# Patient Record
Sex: Female | Born: 1960 | Race: Black or African American | Hispanic: No | Marital: Married | Smoking: Former smoker
Health system: Southern US, Community
[De-identification: ages and names within clinical notes are randomized; demographics above are authoritative.]

## PROBLEM LIST (undated history)

## (undated) DIAGNOSIS — R51 Headache: Secondary | ICD-10-CM

## (undated) DIAGNOSIS — N139 Obstructive and reflux uropathy, unspecified: Secondary | ICD-10-CM

## (undated) DIAGNOSIS — J40 Bronchitis, not specified as acute or chronic: Secondary | ICD-10-CM

## (undated) DIAGNOSIS — E569 Vitamin deficiency, unspecified: Secondary | ICD-10-CM

## (undated) DIAGNOSIS — I10 Essential (primary) hypertension: Secondary | ICD-10-CM

## (undated) DIAGNOSIS — M1990 Unspecified osteoarthritis, unspecified site: Secondary | ICD-10-CM

## (undated) DIAGNOSIS — E785 Hyperlipidemia, unspecified: Secondary | ICD-10-CM

## (undated) DIAGNOSIS — A6000 Herpesviral infection of urogenital system, unspecified: Secondary | ICD-10-CM

## (undated) DIAGNOSIS — R5084 Febrile nonhemolytic transfusion reaction: Secondary | ICD-10-CM

## (undated) DIAGNOSIS — D649 Anemia, unspecified: Secondary | ICD-10-CM

## (undated) DIAGNOSIS — G5601 Carpal tunnel syndrome, right upper limb: Secondary | ICD-10-CM

## (undated) DIAGNOSIS — E119 Type 2 diabetes mellitus without complications: Secondary | ICD-10-CM

## (undated) DIAGNOSIS — J189 Pneumonia, unspecified organism: Secondary | ICD-10-CM

## (undated) HISTORY — DX: Febrile nonhemolytic transfusion reaction: R50.84

## (undated) HISTORY — DX: Carpal tunnel syndrome, right upper limb: G56.01

## (undated) HISTORY — DX: Unspecified osteoarthritis, unspecified site: M19.90

## (undated) HISTORY — PX: ABDOMINAL HYSTERECTOMY: SHX81

## (undated) HISTORY — PX: LIVER SURGERY: SHX698

## (undated) HISTORY — DX: Vitamin deficiency, unspecified: E56.9

## (undated) HISTORY — PX: CHOLECYSTECTOMY: SHX55

## (undated) HISTORY — DX: Herpesviral infection of urogenital system, unspecified: A60.00

## (undated) HISTORY — DX: Essential (primary) hypertension: I10

## (undated) HISTORY — PX: DOPPLER ECHOCARDIOGRAPHY: SHX263

## (undated) HISTORY — DX: Hyperlipidemia, unspecified: E78.5

## (undated) HISTORY — PX: HYPERPLASIA TISSUE EXCISION: SHX5201

## (undated) HISTORY — PX: TUMOR REMOVAL: SHX12

## (undated) HISTORY — PX: CARDIOVASCULAR STRESS TEST: SHX262

---

## 1998-10-26 HISTORY — PX: HAMMER TOE SURGERY: SHX385

## 2000-01-20 ENCOUNTER — Encounter: Payer: Self-pay | Admitting: Cardiology

## 2000-01-20 ENCOUNTER — Ambulatory Visit (HOSPITAL_COMMUNITY): Admission: RE | Admit: 2000-01-20 | Discharge: 2000-01-20 | Payer: Self-pay | Admitting: Cardiology

## 2000-01-27 ENCOUNTER — Ambulatory Visit (HOSPITAL_COMMUNITY): Admission: RE | Admit: 2000-01-27 | Discharge: 2000-01-27 | Payer: Self-pay | Admitting: Cardiology

## 2001-08-16 ENCOUNTER — Inpatient Hospital Stay (HOSPITAL_COMMUNITY): Admission: RE | Admit: 2001-08-16 | Discharge: 2001-08-18 | Payer: Self-pay | Admitting: *Deleted

## 2001-08-16 ENCOUNTER — Encounter (INDEPENDENT_AMBULATORY_CARE_PROVIDER_SITE_OTHER): Payer: Self-pay | Admitting: Specialist

## 2003-07-20 ENCOUNTER — Ambulatory Visit (HOSPITAL_COMMUNITY): Admission: RE | Admit: 2003-07-20 | Discharge: 2003-07-20 | Payer: Self-pay | Admitting: *Deleted

## 2003-07-20 ENCOUNTER — Encounter: Payer: Self-pay | Admitting: *Deleted

## 2008-02-02 ENCOUNTER — Encounter: Admission: RE | Admit: 2008-02-02 | Discharge: 2008-02-02 | Payer: Self-pay | Admitting: Family Medicine

## 2011-03-13 NOTE — Cardiovascular Report (Signed)
[place]. [facility]  Patient:    Renee Mills, Renee Mills                        MRN: [id number] Proc. Date: 01/27/00 Attending:  Colleen Can. Deborah Chalk, M.D. CC:         Cardiac Catheterization Laboratory             Dr. Modena Jansky - [facility]                        Cardiac Catheterization  PROCEDURE:  Right and left heart catheterization with selective coronary angiography.  CARDIOLOGIST:  Colleen Can. Deborah Chalk, M.D.  TYPE & SITE OF ENTRY:  Percutaneous right femoral artery, percutaneous right femoral vein.  CATHETERS:  A 7-French Swan-Ganz thermodilution catheter, 6-French pigtail ventriculographic catheter, 6-French #4 curved right coronary catheter, Judkins  left 4.5, Judkins left 3.5, a multipurpose A1 and a multipurpose A2 catheter, an Amplatz left #1 catheter, and subsequently returned to the Judkins left 3.5, modifying it with a heat gun, with multiple curves, and subsequently creation of out of plane catheter to cannulate the left coronary artery.  CONTRAST MATERIAL:  Omnipaque.  LENGTH OF PROCEDURE:  One hour and 45 minutes.  COMMENTS:  In general the patient tolerated the procedure well.  She received Versed 3 mg IV during the procedure, and Valium 10 mg p.o. preprocedure.  HEMODYNAMIC DATA: Right atrial pressure:  R-wave of 11, V-wave of 8, mean of 6. RV:  27/7. PA:  29/16 - 21. Pulmonary capillary wedge pressure:  11. LV:  116/19. Aortic:  114/75. Fick cardiac output:  5.3, with cardiac index of 2.9. Thermodilution cardiac output:  5.3. Cardiac index:  2.8.  ANGIOGRAPHIC DATA: 1. Right coronary artery:  The right coronary artery was a large dominant    vessel.  By way of the conus branch there were collaterals to a septal    perforating branch and some slight filling of what appeared to be a left    anterior descending coronary artery.  Initially with pictures of the right    coronary artery, we had what appeared to be spasm  which responded nicely    to intercoronary nitroglycerin. 2. Left coronary artery:  The left coronary artery was quite difficult to    cannulate.  Multiple catheters were obtained.  We could get filling of the    artery by way of the flush shot in the aortic root, and from the    left ventriculogram we knew that the artery was present.  Using the 3.5 out f    plane catheter we were finally able to cannulate the left coronary artery.    This catheter was shaped with a heat gun. 3. Left main coronary artery:   The left main coronary artery was normal. 4. Left circumflex coronary artery:  The left circumflex was normal. 5. Left anterior descending coronary artery:  The left anterior descending    was tortuous and coursed over the apex.  LEFT VENTRICULAR ANGIOGRAM:  The left ventricular angiogram was performed in the RAO position.  Overall cardiac size and silhouette were normal.  The left ventricular function was normal, with an ejection fraction of 60%.  OVERALL IMPRESSION: 1. 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 2. Normal left ventricular function. 3. Normal right heart pressures.  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  This could partially account for the anterior T-wave changes that she has on her electrocardiogram.  There is some collateral septal flow which suggests that there may be some abnormality of the proximal coronary flow, although it is not felt o be correctable with any mechanical measure, and may warrant some medication if he symptoms persist.  It is felt that probably modification of cardiovascular risk  factors would benefit her most.  The right femoral artery was closed with Perclose after the procedure, with the  left femoral vein hemostasis obtained manually. DD:  01/27/00 TD:  01/27/00 Job:  [id number]

## 2011-03-13 NOTE — H&P (Signed)
[facility]  Patient:    Renee Mills, Renee Mills Visit Number: [id number] MRN: [id number]          Service Type: Attending:  Marina Gravel, M.D. Dictated by:   Marina Gravel, M.D. Adm. Date:  08/16/01                           History and Physical  PREOPERATIVE DIAGNOSIS:       Uterine fibroids and menorrhagia.  INTENDED PROCEDURE:           Total abdominal hysterectomy.  HISTORY OF PRESENT ILLNESS:   This is a 49 year old African-American female, gravida 3, para 3, with a history of menorrhagia and dysmenorrhea. The bleeding has been severe with large clots necessitating use of super tampon and overnight pad changing every 25 minutes to one hour. Also, associated dyspareunia, mostly on left lower quadrant. This has been an issue for the patient for over two year period of time. She cannot leave her home due to the heavy bleeding. She has taken Motrin 800 mg four to five times daily with menses without relief of bleeding. She is unable to use birth control pills as she has a history of a benign liver tumor. She, therefore, presents for definitive surgical therapy with abdominal hysterectomy. Birth control: The husband has had a vasectomy.  PAST MEDICAL HISTORY:         Benign liver tumor as outlined above. This is followed by Dr. Gerri Spore with serial MRIs and currently no evidence of active disease.  PAST SURGICAL HISTORY:        (1) Partial liver excision for benign tumor and associated cholecystectomy. The patient states approximately one half her liver was removed for this surgery. (2) C-section x 3.  MEDICATIONS:                  Zovirax p.r.n.  ALLERGIES:                    DILAUDID, this causes hallucinations. MOTRIN causes gastrointestinal discomfort.  SOCIAL HISTORY:               No alcohol, tobacco, or other drugs.  FAMILY HISTORY:               Liver tumors, diabetes, heart disease, and hypertension.  REVIEW OF SYSTEMS:             CONSTITUTIONAL: The patient has had problems with weight gain. ENT: No trouble with vision, nose, or mouth. CARDIOVASCULAR: No _______ of chest pain, regular heart beat. RESPIRATORY: No cough or shortness of breath. GASTROINTESTINAL: Patient has intolerance to Motrin. GENITOURINARY: Abnormal bleeding as outlined above. MUSCULOSKELETAL: No joint or muscle pain. SKIN/BREAST: No lesions. NEUROLOGICAL: No history of headaches. PSYCHIATRIC: No work or family problems, history of domestic violence, or sexual assault. ENDOCRINE: No hot flashes, thyroid disease or diabetes. HEMATOLOGIC: No unexplained bruising or bleeding from gums.  PHYSICAL EXAMINATION:  VITAL SIGNS:                  Blood pressure 110/72, weight 173, height 5 feet 2-1/2 inches.  GENERAL:                      Mildly obese. No deformities. Well groomed.  NECK:                         Supple, no thyromegaly.  HEART:  Regular rate and rhythm.  LUNGS:                        Clear to auscultation.  ABDOMEN:                      There is a "Y" shaped incision in the upper abdomen from previous liver surgery and a vertical midline incision from previous C-section. No hernias are palpable. Liver and spleen are normal to palpation.  LYMPH NODE SURVEY:            Negative neck, axilla, and groin.  SKIN:                         No lesions.  BREASTS:                      No dominant masses, nipple discharge, or adenopathy at the axilla or supraclavicular regions.  PELVIC:                       Normal external female genitalia. Vagina and cervix normal. Uterus is enlarged, however, difficult to estimate its size due to her obesity. No adnexal masses are palpable.  LABORATORY DATA:              Pap smear dated July 12, 2001 was within normal limits. Endometrial biopsy dated August 01, 2001 showed benign endocervix and squamous metaplasia. No clear endometrial component was identified. Informed patient  no evidence of malignancy, however, will need to open the uterus after removal to ensure no discrete lesions are seen.  X-RAY:                        Uterus is 10 cm x 7.5 x 6 cm in measurements and there is a fundal 2 cm fibroid. The uterus itself appears very  inhomogeneous throughout with suggestion of numerous fibroids versus adenomyosis. Right ovary appeared normal. Left ovary with a simple 1.2 cm ovarian cyst. No fluid was noted in the cul-de-sac.  ASSESSMENT:                   1. Menorrhagia.                               2. Uterine fibroids.                               3. Possible adenomyosis.  The patients bleeding is severe and she is not a candidate for hormonal manipulation due to history of liver tumor.  PLAN:                         Total abdominal hysterectomy. Discussed with patient given three previous C-sections and multiple abdominal procedures, do not recommend a vaginal route of surgery as may encounter significant adhesions and in addition, increased risk of injury to the bladder with vaginal attempt.  Operative risks discussed including infection, bleeding, damage to bladder, bowel, surrounding organs. All questions answered. The patient wishes to proceed. Arrangements have been made on October 22 at [facility]. Dictated by:   Marina Gravel, M.D. Attending:  Marina Gravel, M.D. DD:  08/14/01 TD:  08/14/01 Job: [id number]

## 2011-03-13 NOTE — H&P (Signed)
[place]. [facility]  Patient:    Renee Mills, Renee Mills                        MRN: [id number] Attending:  Colleen Can. Deborah Chalk, M.D. Dictator:   Jennet Maduro. Earl Gala, R.N., A.N.P. CC:         Jethro Bastos, M.D., [facility]. Deborah Chalk, M.D.                         History and Physical  DATE OF BIRTH:  1961/05/17.  CHIEF COMPLAINT:  Shortness of breath with chest pressure.  HISTORY OF PRESENT ILLNESS:  Renee Mills is a very pleasant 50 year old black  female who was originally referred to our office at the earlier part of March due to complaints of increasing shortness of breath, fatigue, as well as exertional  chest pressure.  We proceeded on with a 2-D echocardiogram, which was basically  unremarkable, showing an ejection fraction of 60%; we also proceeded on with a stress Cardiolite study, which was performed on January 14, 2000, showing reduced  exercise tolerance, with an adequate blood pressure response and clinically negative for angina.  Electrographically, she demonstrated an abnormal EKG with  rest with T wave inversion in leads V3 through V5, as well as in leads III and VF, as well as a small Q wave noted in aVL.  There were no significant changes with  exercise but scintigraphically, there was a relatively small but definite persistent abnormality with apical perfusion.  She continued to have symptoms of shortness of breath and has actually had a couple of spells of PND.  She was referred on for a V/Q scan to rule out pulmonary embolus, which was unremarkable on January 20, 2000.  She continues to have exertional chest discomfort as well and s now referred on for cardiac catheterization.  PAST MEDICAL HISTORY: 1. History of benign tumor removed from the liver in 1997. 2. Laparoscopic cholecystectomy in 1997. 3. Hypertension during pregnancy. 4. Obesity.  ALLERGIES:  None known.  CURRENT MEDICINES:   None.  FAMILY HISTORY:  Father is living at 33 and has had a recent pacemaker implanted. Mother is alive at 53 and has a history of arthritis.  She has one sister who is 59 and in good health.  SOCIAL HISTORY:  She is married.  She works for [employer].  She has two  children, ages 62 and 57, and has previously lost one 68-month-old infant due to  sudden infant death syndrome.  There is no alcohol and no tobacco.  REVIEW OF SYSTEMS:  Otherwise as stated above.  She continues to have a generalized feeling of weakness and fatigue, which she feels is more out of proportion that  what she has usually experienced.  She has had shortness of breath that has actually awakened her from her sleep.  There has been no cough, no fever, no trouble swallowing food or water.  She has exertional chest pressure-like sensation that is relieved with rest.  She has had very little in the way of palpitations and was previously started on a short course of beta blocker therapy which was somewhat equivocal in its response.  PHYSICAL EXAMINATION:  GENERAL:  She is a very pleasant black female in no acute distress.  VITAL SIGNS:  Blood pressure was 140/90 sitting; 130/100, standing.  Heart rate is 92 and regular.  Respirations are 18.  She is afebrile.  SKIN:  Warm and dry.  Color is unremarkable.  LUNGS:  Clear.  CARDIAC:  Regular rate and rhythm.  ABDOMEN:  A large healed incision.  There are no masses and no particular tenderness.  EXTREMITIES:  Without edema.  LABORATORY AND X-RAY FINDINGS:  Laboratory data is currently pending.  Previous EKG shows T wave inversion across the anterior precordium as well as in leads III and aVF with normal sinus rhythm.  Previous chest x-ray is unremarkable.  OVERALL IMPRESSION:  Ongoing episodes of shortness of breath and chest pressure, in the setting of an abnormal stress Cardiolite study.  PLAN:  We will proceed on with right and left  heart catheterization to further discern the etiology of her symptoms.  The risks, procedure and benefits to include the risks of bleeding, bruising, blood clot or being allergic to the x-ray dye s well as the possibility of stroke, irregular rhythms, heart attack and even the  possibility of death were all discussed with her and she is willing to proceed. DD:  01/21/00 TD:  01/21/00 Job: [id number]

## 2011-03-13 NOTE — Op Note (Signed)
[facility]  Patient:    Renee Mills, Renee Mills Visit Number: [id number] MRN: [id number]          Service Type: GYN Location: 9300 [id number] Attending Physician:  Ermalene Searing Dictated by:   Marina Gravel, M.D. Proc. Date: 08/16/01 Admit Date:  08/16/2001                             Operative Report  PREOPERATIVE DIAGNOSES:       Uterine fibroids, menorrhagia.  POSTOPERATIVE DIAGNOSES:      Uterine fibroids, menorrhagia.  PROCEDURE:                    Total abdominal hysterectomy.  SURGEON:                      Marina Gravel, M.D.  ASSISTANT:                    Pershing Cox, M.D.  ANESTHESIA:                   General.  FINDINGS:                     Examination under anesthesia:  A 12-14 week sized uterus.  Operative findings:  Uterine fibroids and possible adenomyosis, normal tubes and ovaries, omental adhesions to the anterior abdominal wall.  SPECIMEN:                     Uterus and cervix.  ESTIMATED BLOOD LOSS:         100.  URINE OUTPUT:                 425.  FLUIDS:                       1800.  COMPLICATIONS:                None.  DRAINS:                       Foley.  DISPOSITION:                  Recovery room, stable.  INDICATIONS:                  Patient with severe menorrhagia.  She is not a hormonal management candidate due to history of benign liver tumor.  Presents for definitive surgical therapy.  PROCEDURE:                    Patient was taken to the operating room and general anesthesia obtained.  She was placed in the supine position, examined under anesthesia with above findings noted.  Rectovaginal examination confirmed.  Patient was then prepped and draped in a standard fashion and Foley catheter inserted into the bladder.  A vertical midline incision was made around the patients old midline scar and the keloid scar excised.  The subcutaneous tissue was then dissected sharply with a knife to the level of  the fascia.  The fascia was divided in the midline with a knife.  The edge of the fascia was elevated with Kocher clamps and the underlying midline and rectus muscles identified and the posterior sheath and peritoneum elevated and entered sharply with a knife. The incision was then extended superiorly and  inferiorly with Bovie cautery with adequate visualization of the surrounding organs.  Omental adhesions were encountered in the superior aspect of the incision and were taken down with the Bovie. Additional omental adhesions were noted well above the inferior vertical midline incision (above the umbilicus in the region of the previous scar from liver surgery).  These adhesions were densely adherent to the abdominal wall. They were palpated and there was no evidence for defect through which the bowel could herniate and therefore they were left intact.  The patient was placed in Trendelenburg position and a self retaining retractor placed into the pelvis.  The bowel was packed superiorly with moist packs.  Care was taken to ensure that the length of the Balfour retractor blade was appropriate for the patients body habitus and no tension was placed on the psoas muscles.  Pelvis was inspected with the above findings noted.  There were adhesions from each side of the colon to the ______ tube.  These were taken down sharply for complete mobilization of the colon.  The bowel was then packed superiorly with moist packs.  Curved Kelly clamps were placed on each uterine cornua.  The left round ligament was identified, placed on traction, suture ligated, and divided.  The retroperitoneal space was developed bluntly and the ureter identified.  It was noted to be well away from the area of dissection.  The left tube and ovary were then isolated and a pedicle was created.  This was then doubly clamped, divided, and suture ligated x 2 with 0 Vicryl with hemostasis obtained.  Repeated on the opposite  side in a similar fashion.  The bladder flap was then created with sharp technique and was mobilized to the level of the external cervical os.  The ureter was then reidentified and the uterine artery skeletonized and clamped at the level of the internal cervical os x 2, divided, and suture ligated with 0 Vicryl with hemostasis obtained.  The bladder was then further mobilized to approximately 2 cm distal to the external cervical os.  The remainder of the cardinal ligaments were then serially clamped with straight Heaney clamps, divided, and suture ligated with 0 Vicryl with hemostasis obtained.  This was continued to the level of the external cervical os.  Each uterosacral ligament was then isolated, clamped with curved Heaney clamp after reidentification of the ureter.  The ligament was then divided and suture ligated with 0 Vicryl.  Repeated on the opposite side in a similar fashion. The vaginal angle was then incised sharply and the vagina circumscribed and the uterus and cervix delivered as an intact specimen, submitted to pathology for fresh inspection.  The vagina was then closed with interrupted figure-of-eight sutures of 0 Vicryl.  Each vaginal angle was then attached to the uterosacral ligament pedicle.  The pelvis was irrigated and a bleeder noted from the right vaginal angle which was made hemostatic with a single figure-of-eight suture of 0 Vicryl after reidentification of the ureter.  A bleeder was noted on the left edge of the vaginal cuff which was made hemostatic with the Bovie. Dictated by:   Marina Gravel, M.D. Attending Physician:  Marina Gravel B DD:  08/16/01 TD:  08/17/01 Job: [id number]

## 2011-03-13 NOTE — Discharge Summary (Signed)
[facility]  Patient:    Renee Mills, Renee Mills Visit Number: [id number] MRN: [id number]          Service Type: GYN Location: 9300 [id number] Attending Physician:  Ermalene Searing Dictated by:   Marina Gravel, M.D. Admit Date:  08/16/2001 Discharge Date: 08/18/2001                             Discharge Summary  ADMISSION DIAGNOSES:          1. Uterine fibroids.                               2. Menorrhagia.  DISCHARGE DIAGNOSES:          1. Uterine fibroids.                               2. Menorrhagia.  PROCEDURE:                    Total abdominal hysterectomy.  HISTORY OF PRESENT ILLNESS:   For complete details, please see the history and physical in the chart. Briefly, the patient is a 50 year old, African-American female, gravida 3, para 3, with a history of menorrhagia and dysmenorrhea. The patient also has a history of benign liver tumor and inability to take hormonal contraception. The patient presents for definitive surgical therapy.  HOSPITAL COURSE:              On October 22, the patient underwent total abdominal hysterectomy. Findings at the time of surgery included 12 to 14-week size uterus with fibroids and probably adenomyosis, with normal appearing tubes and ovaries. Also, omental adhesions were noted to the anterior abdominal wall. There were no complications and the patient tolerated the procedure well.  Postoperatively, the patient rapidly regained her ability to ambulate, void, and tolerate a regular diet. She was discharged home on the second postoperative day in satisfactory condition.  DISCHARGE MEDICATIONS:        1. Percocet one to two p.o. q.4-6h. as needed                                  for pain.                               2. Ferrous sulfate 325 mg p.o. b.i.d. for mild                                  anemia (postoperative hemoglobin 9.1).                               3. Motrin 600 mg p.o. q.6h. as needed for  pain.  DISCHARGE FOLLOWUP:           The patient is to follow up at [facility] in four weeks with Dr. Earlene Plater.  DISPOSITION AT DISCHARGE:     Satisfactory. Dictated by:   Marina Gravel, M.D. Attending Physician:  Marina Gravel B DD:  08/18/01 TD:  08/19/01 Job: [id number]

## 2011-03-13 NOTE — Op Note (Signed)
[facility]  Patient:    Renee Mills, Renee Mills Visit Number: [id number] MRN: [id number]          Service Type: GYN Location: 9300 [id number] Attending Physician:  Ermalene Searing Dictated by:   Marina Gravel, M.D. Proc. Date: 08/16/01 Admit Date:  08/16/2001                             Operative Report  ADDENDUM TO JOB #[id number]  Each ovarian pedicle was then secured to the round ligament with ______ round ligament suture to keep the ovaries out of the pelvis.  The pelvis was then reinspected from the level of the left ovary, crossed between the vaginal cuff and up to the right side, all of which was hemostatic and therefore the case was terminated.  The packs were removed and the bowel returned to the anatomic position.  A fish was inserted and the fascia closed in a running stitch of double stranded #1 PDS.  Just prior to the last few sutures the fish was removed and the remainder of the fascial defect closed in a similar fashion.  The fascial defect was probed and was completely closed.  The subcutaneous tissue was irrigated, made hemostatic with the Bovie.  It was then reapproximated with a layer of 2-0 plain suture in the fat layer.  The skin was then closed with a running subcuticular stitch of 4-0 Vicryl and Steri-Strips applied.  The patient tolerated procedure well.  There were no complications.  Prior to completion of the case the pathology returned as a grossly normal appearing endometrium, suspicion for adenomyosis by gross appearance, and uterine fibroids.  No concern for malignancy per pathology.  The patient was taken to the recovery room awake, alert, in stable condition. All counts were correct per the operating room staff. Dictated by:   Marina Gravel, M.D. Attending Physician:  Marina Gravel B DD:  08/16/01 TD:  08/17/01 Job: [id number]

## 2012-03-09 ENCOUNTER — Ambulatory Visit: Payer: 59 | Admitting: Physical Therapy

## 2012-07-05 ENCOUNTER — Other Ambulatory Visit: Payer: Self-pay | Admitting: Family Medicine

## 2012-07-05 ENCOUNTER — Ambulatory Visit
Admission: RE | Admit: 2012-07-05 | Discharge: 2012-07-05 | Disposition: A | Discharge status: Home / Self Care | Payer: Managed Care, Other (non HMO) | Source: Ambulatory Visit | Attending: Family Medicine | Admitting: Family Medicine

## 2012-07-05 DIAGNOSIS — Z1231 Encounter for screening mammogram for malignant neoplasm of breast: Secondary | ICD-10-CM

## 2012-07-05 DIAGNOSIS — R109 Unspecified abdominal pain: Secondary | ICD-10-CM

## 2012-07-06 ENCOUNTER — Ambulatory Visit
Admission: RE | Admit: 2012-07-06 | Discharge: 2012-07-06 | Disposition: A | Discharge status: Home / Self Care | Payer: Managed Care, Other (non HMO) | Source: Ambulatory Visit | Attending: Family Medicine | Admitting: Family Medicine

## 2012-07-06 DIAGNOSIS — Z1231 Encounter for screening mammogram for malignant neoplasm of breast: Secondary | ICD-10-CM

## 2012-07-07 ENCOUNTER — Other Ambulatory Visit: Payer: Self-pay | Admitting: Family Medicine

## 2012-07-07 DIAGNOSIS — R109 Unspecified abdominal pain: Secondary | ICD-10-CM

## 2012-07-08 ENCOUNTER — Other Ambulatory Visit: Payer: Self-pay | Admitting: Family Medicine

## 2012-07-08 ENCOUNTER — Ambulatory Visit
Admission: RE | Admit: 2012-07-08 | Discharge: 2012-07-08 | Disposition: A | Discharge status: Home / Self Care | Payer: Managed Care, Other (non HMO) | Source: Ambulatory Visit | Attending: Family Medicine | Admitting: Family Medicine

## 2012-07-08 DIAGNOSIS — R109 Unspecified abdominal pain: Secondary | ICD-10-CM

## 2012-07-08 DIAGNOSIS — R928 Other abnormal and inconclusive findings on diagnostic imaging of breast: Secondary | ICD-10-CM

## 2012-07-08 MED ORDER — IOHEXOL 300 MG/ML  SOLN
100.0000 mL | Freq: Once | INTRAMUSCULAR | Status: AC | PRN
  Administered 2012-07-08: 100 mL via INTRAVENOUS

## 2012-07-18 ENCOUNTER — Telehealth (INDEPENDENT_AMBULATORY_CARE_PROVIDER_SITE_OTHER): Payer: Self-pay

## 2012-07-18 NOTE — Telephone Encounter (Signed)
Left message for Ms. Fujii to contact our office, Dr. Dulce Sellar requested Surgical Consultation for patient.  Per Dr. Derrell Lolling patient scheduled for Thursday 07/21/12 @ 9:20 am.

## 2012-07-19 ENCOUNTER — Telehealth (INDEPENDENT_AMBULATORY_CARE_PROVIDER_SITE_OTHER): Payer: Self-pay | Admitting: General Surgery

## 2012-07-19 NOTE — Telephone Encounter (Signed)
Pt returned call and informed of appt on Thur, 07/21/12, at 9:20 with Dr. Derrell Lolling.

## 2012-07-20 ENCOUNTER — Encounter (INDEPENDENT_AMBULATORY_CARE_PROVIDER_SITE_OTHER): Payer: Self-pay | Admitting: General Surgery

## 2012-07-21 ENCOUNTER — Ambulatory Visit (INDEPENDENT_AMBULATORY_CARE_PROVIDER_SITE_OTHER): Payer: Managed Care, Other (non HMO) | Admitting: General Surgery

## 2012-07-21 ENCOUNTER — Encounter (INDEPENDENT_AMBULATORY_CARE_PROVIDER_SITE_OTHER): Payer: Self-pay | Admitting: General Surgery

## 2012-07-21 VITALS — BP 138/80 | HR 80 | Temp 99.0°F | Resp 18 | Ht 63.0 in | Wt 185.6 lb

## 2012-07-21 DIAGNOSIS — K6389 Other specified diseases of intestine: Secondary | ICD-10-CM

## 2012-07-21 MED ORDER — MAGNESIUM CITRATE PO SOLN: 2.0000 | Freq: Once | ORAL | Status: DC

## 2012-07-21 NOTE — Progress Notes (Signed)
Patient ID: Renee Mills, female   DOB: 1961/03/09, 50 y.o.   MRN: [id number]  Chief Complaint  Patient presents with  . New Evaluation    cecal    HPI Renee Mills is a 51 y.o. female is referred from Dr. 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 Patient states occurred in the year patient began having abdominal pain unspecific location. Patient is started while at work patient was eating his neck and can have an abdominal pain that was generalized constipation double over patient continued pain throughout that week and weekend. Patient states she's had some constipation at that time and did not notice any blood in her stool.  Since then the patient has undergone a CT scan and colonoscopy which revealed a right colonic/transverse colon mass as well as IC valve intussusception. Patient states she's loss approximately 12 pounds since indicating that this abdominal pain and has not noticed any blood since that time. Patient states that there is no relieving factors are exacerbating factors. Patient she can only take clear liquids at this time. Patient otherwise denies any diarrhea fevers chills night sweats while at home.  The patient does have a history of benign liver section 1996 as well as cholecystectomy 1986 HPI  Past Medical History  Diagnosis Date  . Hyperlipidemia   . Hypertension   . Genital herpes   . FNHTR (febrile nonhemolytic transfusion reaction)   . Vitamin deficiency   . Arthritis     Past Surgical History  Procedure Date  . Cesarean section   . Abdominal hysterectomy   . Cholecystectomy   . Hammer toe surgery 2000    both feet  . Liver surgery   . Hyperplasia tissue excision     No family history on file.  Social History History  Substance Use Topics  . Smoking status: Never Smoker   . Smokeless tobacco: Not on file  . Alcohol Use: Yes     wine / rare    Allergies    Allergen Reactions  . Lisinopril     cough    Current Outpatient Prescriptions  Medication Sig Dispense Refill  . acyclovir (ZOVIRAX) 400 MG tablet Take 400 mg by mouth every 4 (four) hours while awake.      Marland Kitchen aspirin-acetaminophen-caffeine (EXCEDRIN MIGRAINE) 250-250-65 MG per tablet Take 1 tablet by mouth every 6 (six) hours as needed.      . Cholecalciferol (VITAMIN D3) 1000 UNITS CAPS Take by mouth.      . hyoscyamine (ANASPAZ) 0.125 MG TBDP Place under the tongue.      Marland Kitchen losartan-hydrochlorothiazide (HYZAAR) 100-12.5 MG per tablet Take 1 tablet by mouth daily.      . naproxen sodium (ANAPROX) 220 MG tablet Take 220 mg by mouth 2 (two) times daily with a meal.        Review of Systems Review of Systems  Constitutional: Negative.   HENT: Negative.   Eyes: Negative.   Respiratory: Negative.   Cardiovascular: Negative.   Gastrointestinal: Positive for abdominal pain. Negative for blood in stool.  Musculoskeletal: Negative.   Neurological: Negative.     Blood pressure 138/80, pulse 80, temperature 99 F (37.2 C), temperature source Oral, resp. rate 18, height 5\' 3"  (1.6 m), weight 185 lb 9.6 oz (84.188 kg).  Physical Exam Physical Exam  Constitutional: She is oriented to person, place, and time. She appears well-developed and well-nourished.  HENT:  Head: Normocephalic and atraumatic.  Eyes: Conjunctivae normal are normal. Pupils are equal, round, and reactive to light.  Neck: Normal range of motion. Neck supple.  Cardiovascular: Normal rate and regular rhythm.   Pulmonary/Chest: Effort normal and breath sounds normal.  Abdominal: Soft. She exhibits no distension and no mass. There is tenderness (x4Qs). There is no rebound and no guarding.  Musculoskeletal: Normal range of motion.  Neurological: She is alert and oriented to person, place, and time.    Data Reviewed CT scan of abdomen pelvis reveals a right-sided colonic mass. Colonoscopy reveals colonic mass. Biopsy of  colonoscopy reveals rhonchus erosive colonic mucosa no evidence of inflammation or chronicity. There dysplasia adenomatous changes or malignancy.  Assessment    The patient is a 51 year old female with a right-sided/transverse colon colonic mass. 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 I discussed the path report with the patient secondary to a growth and size of a colonic mass we discussed the options for surgery to essentially avoid a future obstruction. Patient understood that the mass was large enough to possibly cause obstructionin the future like to proceed with surgery at this time.    Plan    1. We'll schedule the patient for laparoscopic versus open right hemicolectomy. 2. All risks and benefits were discussed with the patient, to generally include infection, bleeding, damage to surrounding structures, and recurrence. Alternatives were offered and described.  All questions were answered and the patient voiced understanding of the procedure and wishes to proceed at this point.        Marigene Ehlers., [name] 07/21/2012, 10:36 AM

## 2012-07-25 ENCOUNTER — Telehealth (INDEPENDENT_AMBULATORY_CARE_PROVIDER_SITE_OTHER): Payer: Self-pay | Admitting: General Surgery

## 2012-07-25 ENCOUNTER — Other Ambulatory Visit: Payer: Managed Care, Other (non HMO)

## 2012-07-25 NOTE — Telephone Encounter (Signed)
Pt called to see if surgery can be moved sooner.  She is now experiencing an increase in pain (for last 3 days) and finding it difficult to work.  Aleve does not work anymore.  She cannot take narcotics and work.  Explained the difficultly with moving a surgery, but she may need to be out of work.  Pt would like to discuss options with Dr. Derrell Lolling, please.

## 2012-08-02 ENCOUNTER — Encounter (HOSPITAL_COMMUNITY)
Admission: RE | Admit: 2012-08-02 | Discharge: 2012-08-02 | Disposition: A | Discharge status: Home / Self Care | Payer: Managed Care, Other (non HMO) | Source: Ambulatory Visit | Attending: General Surgery | Admitting: General Surgery

## 2012-08-02 ENCOUNTER — Encounter (HOSPITAL_COMMUNITY): Payer: Self-pay

## 2012-08-02 HISTORY — DX: Pneumonia, unspecified organism: J18.9

## 2012-08-02 HISTORY — DX: Obstructive and reflux uropathy, unspecified: N13.9

## 2012-08-02 HISTORY — DX: Anemia, unspecified: D64.9

## 2012-08-02 HISTORY — DX: Headache: R51

## 2012-08-02 HISTORY — DX: Bronchitis, not specified as acute or chronic: J40

## 2012-08-02 LAB — BASIC METABOLIC PANEL
BUN: 4 mg/dL — ABNORMAL LOW (ref 6–23)
Creatinine, Ser: 0.67 mg/dL (ref 0.50–1.10)
GFR calc Af Amer: >90 mL/min (ref >90)
GFR calc non Af Amer: >90 mL/min (ref >90)

## 2012-08-02 LAB — CBC
MCHC: 33.2 g/dL (ref 30.0–36.0)
RDW: 14.3 % (ref 11.5–15.5)

## 2012-08-02 LAB — ABO/RH: ABO/RH(D): O POS

## 2012-08-02 MED ORDER — CHLORHEXIDINE GLUCONATE 4 % EX LIQD: 1.0000 "application " | Freq: Once | CUTANEOUS | Status: DC

## 2012-08-02 NOTE — Pre-Procedure Instructions (Signed)
20 Renee Mills  08/02/2012   Your procedure is scheduled on:  Tuesday August 09, 2012  Report to [facility] at 5:30 AM.  Call this number if you have problems the morning of surgery: [phone]   Remember:   Do not eat food or drink :After Midnight.      Take these medicines the morning of surgery with A SIP OF WATER: acyclovir   Do not wear jewelry, make-up or nail polish.  Do not wear lotions, powders, or perfumes. You may wear deodorant.  Do not shave 48 hours prior to surgery. Men may shave face and neck.  Do not bring valuables to the hospital.  Contacts, dentures or bridgework may not be worn into surgery.  Leave suitcase in the car. After surgery it may be brought to your room.  For patients admitted to the hospital, checkout time is 11:00 AM the day of discharge.   Patients discharged the day of surgery will not be allowed to drive home.  Name and phone number of your driver: family / friend  Special Instructions: Shower using CHG 2 nights before surgery and the night before surgery.  If you shower the day of surgery use CHG.  Use special wash - you have one bottle of CHG for all showers.  You should use approximately 1/3 of the bottle for each shower.   Please read over the following fact sheets that you were given: Pain Booklet, Coughing and Deep Breathing, MRSA Information and Surgical Site Infection Prevention

## 2012-08-03 NOTE — Consult Note (Signed)
Anesthesia chart review: Patient is a 51 year old female scheduled for laparoscopic versus open right he may colectomy by Dr. Derrell Lolling on 08/09/2012.  History includes hyperlipidemia, hypertension, arthritis, cholecystectomy, febrile nonhemolytic transfusion reaction, liver resection for benign mass, non-smoker, obesity, PNA, anemia, headaches Herpes Simplex 2.  PCP is Dr. Shaune Pollack.  Labs noted.  T&S done.  CXR on 08/02/12 was negative.  EKG on 08/02/12 showed normal sinus rhythm, possible left atrial enlargement, inferior and anterior ST/T-wave abnormality, consider ischemia.  I received two comparison EKGs from [place] at [place] dating back to at least 12/23/99.  Her r wave progression in septal leads is worse and her T wave inversion in lead II is more pronounced, but  otherwise I think her T wave inversion in precordial and inferior leads appear stable.   She had a cardiac cath by Dr. Roger Shelter on 01/27/00 that showed: OVERALL IMPRESSION:  1. 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  2. Normal left ventricular function, EF 60%.  3. Normal right heart pressures.  DISCUSSION: It is felt that Ms. Liston probably does not have any significant coronary pathology, although she does have some anomalous coronaries. This could  partially account for the anterior T-wave changes that she has on her electrocardiogram. There is some collateral septal flow which suggests that there may be some abnormality of the proximal coronary flow, although it is not felt to be correctable with any mechanical measure, and may warrant some medication if the symptoms persist. It is felt that probably modification of cardiovascular risk  factors would benefit her most.   I reviewed above with Anesthesiologist Dr. Katrinka Blazing.  If no new/acute CV symptoms then would plan to proceed.  I left patient a message to call me, otherwise clinical correlation on the day of surgery.  Shonna Chock, PA-C

## 2012-08-04 ENCOUNTER — Encounter (INDEPENDENT_AMBULATORY_CARE_PROVIDER_SITE_OTHER): Payer: Self-pay

## 2012-08-08 MED ORDER — ERTAPENEM SODIUM 1 G IJ SOLR
1.0000 g | INTRAMUSCULAR | Status: AC
  Administered 2012-08-09: 1 g via INTRAVENOUS
  Filled 2012-08-08: qty 1

## 2012-08-08 MED ORDER — ALVIMOPAN 12 MG PO CAPS
12.0000 mg | ORAL_CAPSULE | Freq: Once | ORAL | Status: AC
  Administered 2012-08-09: 12 mg via ORAL
  Filled 2012-08-08: qty 1

## 2012-08-09 ENCOUNTER — Encounter (HOSPITAL_COMMUNITY): Payer: Self-pay | Admitting: Vascular Surgery

## 2012-08-09 ENCOUNTER — Encounter (HOSPITAL_COMMUNITY): Payer: Self-pay | Admitting: *Deleted

## 2012-08-09 ENCOUNTER — Inpatient Hospital Stay (HOSPITAL_COMMUNITY)
Admission: RE | Admit: 2012-08-09 | Discharge: 2012-08-14 | DRG: 330 | Disposition: A | Discharge status: Home / Self Care | Payer: Managed Care, Other (non HMO) | Source: Ambulatory Visit | Attending: General Surgery | Admitting: General Surgery

## 2012-08-09 ENCOUNTER — Encounter (HOSPITAL_COMMUNITY)
Admission: RE | Disposition: A | Discharge status: Home / Self Care | Payer: Self-pay | Source: Ambulatory Visit | Attending: General Surgery

## 2012-08-09 ENCOUNTER — Ambulatory Visit (HOSPITAL_COMMUNITY): Payer: Managed Care, Other (non HMO) | Admitting: Vascular Surgery

## 2012-08-09 DIAGNOSIS — K639 Disease of intestine, unspecified: Principal | ICD-10-CM | POA: Diagnosis present

## 2012-08-09 DIAGNOSIS — K219 Gastro-esophageal reflux disease without esophagitis: Secondary | ICD-10-CM | POA: Diagnosis present

## 2012-08-09 DIAGNOSIS — D175 Benign lipomatous neoplasm of intra-abdominal organs: Secondary | ICD-10-CM

## 2012-08-09 DIAGNOSIS — I10 Essential (primary) hypertension: Secondary | ICD-10-CM | POA: Diagnosis present

## 2012-08-09 DIAGNOSIS — A6000 Herpesviral infection of urogenital system, unspecified: Secondary | ICD-10-CM | POA: Diagnosis present

## 2012-08-09 DIAGNOSIS — Z79899 Other long term (current) drug therapy: Secondary | ICD-10-CM

## 2012-08-09 DIAGNOSIS — Z01812 Encounter for preprocedural laboratory examination: Secondary | ICD-10-CM

## 2012-08-09 DIAGNOSIS — K6389 Other specified diseases of intestine: Secondary | ICD-10-CM

## 2012-08-09 DIAGNOSIS — K660 Peritoneal adhesions (postprocedural) (postinfection): Secondary | ICD-10-CM | POA: Diagnosis present

## 2012-08-09 DIAGNOSIS — D62 Acute posthemorrhagic anemia: Secondary | ICD-10-CM | POA: Diagnosis not present

## 2012-08-09 DIAGNOSIS — K5730 Diverticulosis of large intestine without perforation or abscess without bleeding: Secondary | ICD-10-CM

## 2012-08-09 DIAGNOSIS — Z01818 Encounter for other preprocedural examination: Secondary | ICD-10-CM

## 2012-08-09 DIAGNOSIS — E785 Hyperlipidemia, unspecified: Secondary | ICD-10-CM | POA: Diagnosis present

## 2012-08-09 DIAGNOSIS — Z01810 Encounter for preprocedural cardiovascular examination: Secondary | ICD-10-CM

## 2012-08-09 DIAGNOSIS — M129 Arthropathy, unspecified: Secondary | ICD-10-CM | POA: Diagnosis present

## 2012-08-09 DIAGNOSIS — Z6832 Body mass index (BMI) 32.0-32.9, adult: Secondary | ICD-10-CM

## 2012-08-09 LAB — CBC
HCT: 39.2 % (ref 36.0–46.0)
MCH: 27.2 pg (ref 26.0–34.0)
MCV: 82.5 fL (ref 78.0–100.0)
Platelets: 223 10*3/uL (ref 150–400)
RBC: 4.75 MIL/uL (ref 3.87–5.11)
RDW: 14 % (ref 11.5–15.5)

## 2012-08-09 SURGERY — LAPAROSCOPIC RIGHT HEMI COLECTOMY
Anesthesia: General | Laterality: Right | Wound class: Clean Contaminated

## 2012-08-09 MED ORDER — LACTATED RINGERS IV SOLN
INTRAVENOUS | Status: DC | PRN
  Administered 2012-08-09 (×3): via INTRAVENOUS

## 2012-08-09 MED ORDER — SODIUM CHLORIDE 0.9 % IR SOLN
Status: DC | PRN
  Administered 2012-08-09: 1000 mL

## 2012-08-09 MED ORDER — SODIUM CHLORIDE 0.9 % IJ SOLN: 9.0000 mL | INTRAMUSCULAR | Status: DC | PRN

## 2012-08-09 MED ORDER — ONDANSETRON HCL 4 MG/2ML IJ SOLN: 4.0000 mg | Freq: Four times a day (QID) | INTRAMUSCULAR | Status: DC | PRN

## 2012-08-09 MED ORDER — ONDANSETRON HCL 4 MG/2ML IJ SOLN
INTRAMUSCULAR | Status: DC | PRN
  Administered 2012-08-09: 4 mg via INTRAVENOUS

## 2012-08-09 MED ORDER — LIDOCAINE HCL (CARDIAC) 20 MG/ML IV SOLN
INTRAVENOUS | Status: DC | PRN
  Administered 2012-08-09: 20 mg via INTRAVENOUS

## 2012-08-09 MED ORDER — DEXTROSE IN LACTATED RINGERS 5 % IV SOLN
INTRAVENOUS | Status: DC
  Administered 2012-08-09: 1000 mL via INTRAVENOUS
  Administered 2012-08-10 (×3): via INTRAVENOUS
  Administered 2012-08-11: 100 mL/h via INTRAVENOUS
  Administered 2012-08-12: 125 mL/h via INTRAVENOUS
  Administered 2012-08-12: 11:00:00 via INTRAVENOUS

## 2012-08-09 MED ORDER — DIPHENHYDRAMINE HCL 50 MG/ML IJ SOLN: 12.5000 mg | Freq: Four times a day (QID) | INTRAMUSCULAR | Status: DC | PRN

## 2012-08-09 MED ORDER — HYDROMORPHONE 0.3 MG/ML IV SOLN
INTRAVENOUS | Status: AC
  Filled 2012-08-09: qty 25

## 2012-08-09 MED ORDER — MORPHINE SULFATE (PF) 1 MG/ML IV SOLN
INTRAVENOUS | Status: DC
  Administered 2012-08-09: 20:00:00 via INTRAVENOUS
  Administered 2012-08-10 (×2): 1.5 mg via INTRAVENOUS
  Administered 2012-08-10: 6 mg via INTRAVENOUS
  Administered 2012-08-10 – 2012-08-11 (×4): 1.5 mg via INTRAVENOUS
  Administered 2012-08-11: 0.01 mg via INTRAVENOUS
  Administered 2012-08-12: 1.5 mg via INTRAVENOUS
  Administered 2012-08-12: via INTRAVENOUS
  Administered 2012-08-12: 1.5 mg via INTRAVENOUS
  Filled 2012-08-09 (×2): qty 25

## 2012-08-09 MED ORDER — DIPHENHYDRAMINE HCL 12.5 MG/5ML PO ELIX: 12.5000 mg | ORAL_SOLUTION | Freq: Four times a day (QID) | ORAL | Status: DC | PRN

## 2012-08-09 MED ORDER — GLYCOPYRROLATE 0.2 MG/ML IJ SOLN
INTRAMUSCULAR | Status: DC | PRN
  Administered 2012-08-09: 0.4 mg via INTRAVENOUS

## 2012-08-09 MED ORDER — HYDROMORPHONE HCL PF 1 MG/ML IJ SOLN
0.2500 mg | INTRAMUSCULAR | Status: DC | PRN
  Administered 2012-08-09: 0.5 mg via INTRAVENOUS

## 2012-08-09 MED ORDER — FENTANYL CITRATE 0.05 MG/ML IJ SOLN
INTRAMUSCULAR | Status: DC | PRN
  Administered 2012-08-09: 50 ug via INTRAVENOUS
  Administered 2012-08-09: 250 ug via INTRAVENOUS
  Administered 2012-08-09 (×2): 50 ug via INTRAVENOUS
  Administered 2012-08-09: 100 ug via INTRAVENOUS

## 2012-08-09 MED ORDER — OXYCODONE HCL 5 MG/5ML PO SOLN: 5.0000 mg | Freq: Once | ORAL | Status: DC | PRN

## 2012-08-09 MED ORDER — HYDROMORPHONE 0.3 MG/ML IV SOLN
INTRAVENOUS | Status: DC
  Administered 2012-08-09: 12:00:00 via INTRAVENOUS
  Administered 2012-08-09: 0.9 mg via INTRAVENOUS

## 2012-08-09 MED ORDER — MORPHINE SULFATE (PF) 1 MG/ML IV SOLN: INTRAVENOUS | Status: DC

## 2012-08-09 MED ORDER — PHENYLEPHRINE HCL 10 MG/ML IJ SOLN
INTRAMUSCULAR | Status: DC | PRN
  Administered 2012-08-09: 80 ug via INTRAVENOUS

## 2012-08-09 MED ORDER — NALOXONE HCL 0.4 MG/ML IJ SOLN: 0.4000 mg | INTRAMUSCULAR | Status: DC | PRN

## 2012-08-09 MED ORDER — PROMETHAZINE HCL 25 MG/ML IJ SOLN: 6.2500 mg | INTRAMUSCULAR | Status: DC | PRN

## 2012-08-09 MED ORDER — 0.9 % SODIUM CHLORIDE (POUR BTL) OPTIME
TOPICAL | Status: DC | PRN
  Administered 2012-08-09 (×2): 1000 mL

## 2012-08-09 MED ORDER — MIDAZOLAM HCL 5 MG/5ML IJ SOLN
INTRAMUSCULAR | Status: DC | PRN
  Administered 2012-08-09: 2 mg via INTRAVENOUS

## 2012-08-09 MED ORDER — PROPOFOL 10 MG/ML IV BOLUS
INTRAVENOUS | Status: DC | PRN
  Administered 2012-08-09: 150 mg via INTRAVENOUS

## 2012-08-09 MED ORDER — BUPIVACAINE-EPINEPHRINE 0.25% -1:200000 IJ SOLN
INTRAMUSCULAR | Status: DC | PRN
  Administered 2012-08-09: 14 mL

## 2012-08-09 MED ORDER — DIPHENHYDRAMINE HCL 12.5 MG/5ML PO ELIX
12.5000 mg | ORAL_SOLUTION | Freq: Four times a day (QID) | ORAL | Status: DC | PRN
  Filled 2012-08-09: qty 5

## 2012-08-09 MED ORDER — HYDROMORPHONE HCL PF 1 MG/ML IJ SOLN
INTRAMUSCULAR | Status: AC
  Filled 2012-08-09: qty 2

## 2012-08-09 MED ORDER — MEPERIDINE HCL 25 MG/ML IJ SOLN: 6.2500 mg | INTRAMUSCULAR | Status: DC | PRN

## 2012-08-09 MED ORDER — MIDAZOLAM HCL 2 MG/2ML IJ SOLN: 0.5000 mg | Freq: Once | INTRAMUSCULAR | Status: DC | PRN

## 2012-08-09 MED ORDER — BUPIVACAINE-EPINEPHRINE PF 0.25-1:200000 % IJ SOLN
INTRAMUSCULAR | Status: AC
  Filled 2012-08-09: qty 30

## 2012-08-09 MED ORDER — ONDANSETRON HCL 4 MG/2ML IJ SOLN
4.0000 mg | Freq: Four times a day (QID) | INTRAMUSCULAR | Status: DC | PRN
  Administered 2012-08-09 (×2): 4 mg via INTRAVENOUS
  Filled 2012-08-09 (×3): qty 2

## 2012-08-09 MED ORDER — ROCURONIUM BROMIDE 100 MG/10ML IV SOLN
INTRAVENOUS | Status: DC | PRN
  Administered 2012-08-09: 20 mg via INTRAVENOUS
  Administered 2012-08-09: 10 mg via INTRAVENOUS
  Administered 2012-08-09: 50 mg via INTRAVENOUS

## 2012-08-09 MED ORDER — NEOSTIGMINE METHYLSULFATE 1 MG/ML IJ SOLN
INTRAMUSCULAR | Status: DC | PRN
  Administered 2012-08-09: 3 mg via INTRAVENOUS

## 2012-08-09 MED ORDER — WHITE PETROLATUM GEL
Status: AC
  Administered 2012-08-09: 19:00:00
  Filled 2012-08-09: qty 5

## 2012-08-09 MED ORDER — DEXAMETHASONE SODIUM PHOSPHATE 4 MG/ML IJ SOLN
INTRAMUSCULAR | Status: DC | PRN
  Administered 2012-08-09: 4 mg via INTRAVENOUS

## 2012-08-09 MED ORDER — PANTOPRAZOLE SODIUM 40 MG IV SOLR
40.0000 mg | Freq: Every day | INTRAVENOUS | Status: DC
  Administered 2012-08-09 – 2012-08-12 (×3): 40 mg via INTRAVENOUS
  Filled 2012-08-09 (×5): qty 40

## 2012-08-09 MED ORDER — OXYCODONE HCL 5 MG PO TABS: 5.0000 mg | ORAL_TABLET | Freq: Once | ORAL | Status: DC | PRN

## 2012-08-09 MED ORDER — SODIUM CHLORIDE 0.9 % IV SOLN
1.0000 g | Freq: Once | INTRAVENOUS | Status: AC
  Administered 2012-08-10: 1 g via INTRAVENOUS
  Filled 2012-08-09: qty 1

## 2012-08-09 MED ORDER — BIOTENE DRY MOUTH MT LIQD
15.0000 mL | Freq: Two times a day (BID) | OROMUCOSAL | Status: DC
  Administered 2012-08-09 – 2012-08-12 (×6): 15 mL via OROMUCOSAL

## 2012-08-09 SURGICAL SUPPLY — 71 items
APPLIER CLIP ROT 10 11.4 M/L (STAPLE)
BLADE SURG 10 STRL SS (BLADE) ×2 IMPLANT
BLADE SURG ROTATE 9660 (MISCELLANEOUS) IMPLANT
CANISTER SUCTION 2500CC (MISCELLANEOUS) ×2 IMPLANT
CELLS DAT CNTRL 66122 CELL SVR (MISCELLANEOUS) IMPLANT
CHLORAPREP W/TINT 26ML (MISCELLANEOUS) ×2 IMPLANT
CLIP APPLIE ROT 10 11.4 M/L (STAPLE) IMPLANT
CLOTH BEACON ORANGE TIMEOUT ST (SAFETY) ×2 IMPLANT
COVER SURGICAL LIGHT HANDLE (MISCELLANEOUS) ×2 IMPLANT
DECANTER SPIKE VIAL GLASS SM (MISCELLANEOUS) ×2 IMPLANT
DRAPE PROXIMA HALF (DRAPES) ×4 IMPLANT
DRAPE UTILITY 15X26 W/TAPE STR (DRAPE) ×4 IMPLANT
DRAPE WARM FLUID 44X44 (DRAPE) ×2 IMPLANT
ELECT CAUTERY BLADE 6.4 (BLADE) ×4 IMPLANT
ELECT REM PT RETURN 9FT ADLT (ELECTROSURGICAL) ×2
ELECTRODE REM PT RTRN 9FT ADLT (ELECTROSURGICAL) ×1 IMPLANT
ENDOLOOP SUT PDS II  0 18 (SUTURE) ×1
ENDOLOOP SUT PDS II 0 18 (SUTURE) ×1 IMPLANT
GEL ULTRASOUND 20GR AQUASONIC (MISCELLANEOUS) IMPLANT
GLOVE BIO SURGEON STRL SZ7 (GLOVE) ×4 IMPLANT
GLOVE BIO SURGEON STRL SZ7.5 (GLOVE) ×4 IMPLANT
GLOVE BIOGEL PI IND STRL 7.0 (GLOVE) ×3 IMPLANT
GLOVE BIOGEL PI IND STRL 7.5 (GLOVE) ×3 IMPLANT
GLOVE BIOGEL PI INDICATOR 7.0 (GLOVE) ×3
GLOVE BIOGEL PI INDICATOR 7.5 (GLOVE) ×3
GLOVE ECLIPSE 6.5 STRL STRAW (GLOVE) ×8 IMPLANT
GLOVE SURG SS PI 7.0 STRL IVOR (GLOVE) ×2 IMPLANT
GOWN STRL NON-REIN LRG LVL3 (GOWN DISPOSABLE) ×14 IMPLANT
HANDLE UNIV ENDO GIA (ENDOMECHANICALS) ×2 IMPLANT
KIT BASIN OR (CUSTOM PROCEDURE TRAY) ×2 IMPLANT
KIT ROOM TURNOVER OR (KITS) ×2 IMPLANT
LAPAROSCOPIC LIGASURE BLUNT TIP ×2 IMPLANT
LEGGING LITHOTOMY PAIR STRL (DRAPES) ×2 IMPLANT
LIGASURE IMPACT 36 18CM CVD LR (INSTRUMENTS) IMPLANT
NS IRRIG 1000ML POUR BTL (IV SOLUTION) ×4 IMPLANT
PAD ARMBOARD 7.5X6 YLW CONV (MISCELLANEOUS) ×4 IMPLANT
PENCIL BUTTON HOLSTER BLD 10FT (ELECTRODE) ×2 IMPLANT
RELOAD EGIA 45 MED/THCK PURPLE (STAPLE) ×2 IMPLANT
RELOAD EGIA 60 MED/THCK PURPLE (STAPLE) ×6 IMPLANT
RTRCTR WOUND ALEXIS 18CM MED (MISCELLANEOUS)
SCALPEL HARMONIC ACE (MISCELLANEOUS) ×2 IMPLANT
SCISSORS LAP 5X35 DISP (ENDOMECHANICALS) ×2 IMPLANT
SET IRRIG TUBING LAPAROSCOPIC (IRRIGATION / IRRIGATOR) ×2 IMPLANT
SLEEVE ENDOPATH XCEL 5M (ENDOMECHANICALS) ×6 IMPLANT
SPECIMEN JAR LARGE (MISCELLANEOUS) ×2 IMPLANT
SPONGE GAUZE 4X4 12PLY (GAUZE/BANDAGES/DRESSINGS) ×2 IMPLANT
STAPLER VISISTAT 35W (STAPLE) ×2 IMPLANT
SUT PDS AB 1 CT  36 (SUTURE) ×3
SUT PDS AB 1 CT 36 (SUTURE) ×3 IMPLANT
SUT SILK 2 0 (SUTURE) ×1
SUT SILK 2 0 SH CR/8 (SUTURE) ×2 IMPLANT
SUT SILK 2-0 18XBRD TIE 12 (SUTURE) ×1 IMPLANT
SUT SILK 3 0 (SUTURE) ×1
SUT SILK 3 0 SH CR/8 (SUTURE) ×2 IMPLANT
SUT SILK 3-0 18XBRD TIE 12 (SUTURE) ×1 IMPLANT
SUT VIC AB 1 CT1 27 (SUTURE) ×1
SUT VIC AB 1 CT1 27XBRD ANBCTR (SUTURE) ×1 IMPLANT
SYS LAPSCP GELPORT 120MM (MISCELLANEOUS)
SYSTEM LAPSCP GELPORT 120MM (MISCELLANEOUS) IMPLANT
TAPE CLOTH SURG 4X10 WHT LF (GAUZE/BANDAGES/DRESSINGS) ×2 IMPLANT
TOWEL OR 17X24 6PK STRL BLUE (TOWEL DISPOSABLE) ×2 IMPLANT
TOWEL OR 17X26 10 PK STRL BLUE (TOWEL DISPOSABLE) ×2 IMPLANT
TRAY FOLEY CATH 14FRSI W/METER (CATHETERS) ×2 IMPLANT
TRAY LAPAROSCOPIC (CUSTOM PROCEDURE TRAY) ×2 IMPLANT
TROCAR XCEL 12X100 BLDLESS (ENDOMECHANICALS) ×2 IMPLANT
TROCAR XCEL BLUNT TIP 100MML (ENDOMECHANICALS) IMPLANT
TROCAR XCEL NON-BLD 11X100MML (ENDOMECHANICALS) ×2 IMPLANT
TROCAR XCEL NON-BLD 5MMX100MML (ENDOMECHANICALS) ×2 IMPLANT
TUBE CONNECTING 12X1/4 (SUCTIONS) ×2 IMPLANT
WATER STERILE IRR 1000ML POUR (IV SOLUTION) IMPLANT
YANKAUER SUCT BULB TIP NO VENT (SUCTIONS) ×4 IMPLANT

## 2012-08-09 NOTE — Interval H&P Note (Signed)
History and Physical Interval Note:  08/09/2012 7:02 AM  Renee Mills  has presented today for surgery, with the diagnosis of Right colon mass  The various methods of treatment have been discussed with the patient and family. After consideration of risks, benefits and other options for treatment, the patient has consented to  Procedure(s) (LRB) with comments: LAPAROSCOPIC RIGHT HEMI COLECTOMY (Right) - laparoscopic vs open right hemi colectomy as a surgical intervention .  The patient's history has been reviewed, patient examined, no change in status, stable for surgery.  I have reviewed the patient's chart and labs.  Questions were answered to the patient's satisfaction.     Marigene Ehlers., Jed Limerick

## 2012-08-09 NOTE — Transfer of Care (Signed)
Immediate Anesthesia Transfer of Care Note  Patient: Renee Mills  Procedure(s) Performed: Procedure(s) (LRB) with comments: LAPAROSCOPIC RIGHT HEMI COLECTOMY (Right) - laparoscopic vs open right hemi colectomy  Patient Location: PACU  Anesthesia Type: General  Level of Consciousness: awake and alert   Airway & Oxygen Therapy: Patient Spontanous Breathing and Patient connected to nasal cannula oxygen  Post-op Assessment: Report given to PACU RN and Post -op Vital signs reviewed and stable  Post vital signs: Reviewed and stable  Complications: No apparent anesthesia complications

## 2012-08-09 NOTE — Progress Notes (Signed)
Patient occluded airway.  Chin life immediately started, called for assistance.  Help Immediately at bedside,  Dr. Noreene Larsson  Bag mask ventilated the patient for 4-5 ventilations, then inserted a Nasal airway in the left nare.  Patient tolerated well.  Placed on simple mask afterward with O2 sat of 100%.  Maintaining airway well, Nasal airway remains in place.

## 2012-08-09 NOTE — Anesthesia Postprocedure Evaluation (Signed)
  Anesthesia Post-op Note  Patient: Renee Mills  Procedure(s) Performed: Procedure(s) (LRB) with comments: LAPAROSCOPIC RIGHT HEMI COLECTOMY (Right) - laparoscopic vs open right hemi colectomy  Patient Location: PACU  Anesthesia Type: General  Level of Consciousness: sedated, patient cooperative and responds to stimulation and voice  Airway and Oxygen Therapy: Patient Spontanous Breathing and Patient connected to nasal cannula oxygen  Post-op Pain: mild  Post-op Assessment: Post-op Vital signs reviewed, Patient's Cardiovascular Status Stable, Respiratory Function Stable, Patent Airway, No signs of Nausea or vomiting and Pain level controlled  Post-op Vital Signs: Reviewed and stable  Complications: No apparent anesthesia complications

## 2012-08-09 NOTE — Anesthesia Procedure Notes (Signed)
Procedure Name: Intubation Date/Time: 08/09/2012 8:06 AM Performed by: Jerilee Hoh Pre-anesthesia Checklist: Patient identified, Emergency Drugs available, Suction available and Patient being monitored Patient Re-evaluated:Patient Re-evaluated prior to inductionOxygen Delivery Method: Circle system utilized Preoxygenation: Pre-oxygenation with 100% oxygen Intubation Type: IV induction Ventilation: Mask ventilation without difficulty Laryngoscope Size: Mac and 3 Grade View: Grade II Tube type: Oral Tube size: 7.5 mm Number of attempts: 1 Airway Equipment and Method: Stylet Placement Confirmation: ETT inserted through vocal cords under direct vision,  positive ETCO2 and breath sounds checked- equal and bilateral Secured at: 22 cm Tube secured with: Tape Dental Injury: Teeth and Oropharynx as per pre-operative assessment

## 2012-08-09 NOTE — H&P (View-Only) (Signed)
Patient ID: Renee Mills, female   DOB: 04/12/1961, 51 y.o.   MRN: [id number]  Chief Complaint  Patient presents with  . New Evaluation    cecal    HPI Renee Mills is a 51 y.o. female is referred from Dr. 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 Patient states occurred in the year patient began having abdominal pain unspecific location. Patient is started while at work patient was eating his neck and can have an abdominal pain that was generalized constipation double over patient continued pain throughout that week and weekend. Patient states she's had some constipation at that time and did not notice any blood in her stool.  Since then the patient has undergone a CT scan and colonoscopy which revealed a right colonic/transverse colon mass as well as IC valve intussusception. Patient states she's loss approximately 12 pounds since indicating that this abdominal pain and has not noticed any blood since that time. Patient states that there is no relieving factors are exacerbating factors. Patient she can only take clear liquids at this time. Patient otherwise denies any diarrhea fevers chills night sweats while at home.  The patient does have a history of benign liver section 1996 as well as cholecystectomy 1986 HPI  Past Medical History  Diagnosis Date  . Hyperlipidemia   . Hypertension   . Genital herpes   . FNHTR (febrile nonhemolytic transfusion reaction)   . Vitamin deficiency   . Arthritis     Past Surgical History  Procedure Date  . Cesarean section   . Abdominal hysterectomy   . Cholecystectomy   . Hammer toe surgery 2000    both feet  . Liver surgery   . Hyperplasia tissue excision     No family history on file.  Social History History  Substance Use Topics  . Smoking status: Never Smoker   . Smokeless tobacco: Not on file  . Alcohol Use: Yes     wine / rare    Allergies    Allergen Reactions  . Lisinopril     cough    Current Outpatient Prescriptions  Medication Sig Dispense Refill  . acyclovir (ZOVIRAX) 400 MG tablet Take 400 mg by mouth every 4 (four) hours while awake.      . aspirin-acetaminophen-caffeine (EXCEDRIN MIGRAINE) 250-250-65 MG per tablet Take 1 tablet by mouth every 6 (six) hours as needed.      . Cholecalciferol (VITAMIN D3) 1000 UNITS CAPS Take by mouth.      . hyoscyamine (ANASPAZ) 0.125 MG TBDP Place under the tongue.      . losartan-hydrochlorothiazide (HYZAAR) 100-12.5 MG per tablet Take 1 tablet by mouth daily.      . naproxen sodium (ANAPROX) 220 MG tablet Take 220 mg by mouth 2 (two) times daily with a meal.        Review of Systems Review of Systems  Constitutional: Negative.   HENT: Negative.   Eyes: Negative.   Respiratory: Negative.   Cardiovascular: Negative.   Gastrointestinal: Positive for abdominal pain. Negative for blood in stool.  Musculoskeletal: Negative.   Neurological: Negative.     Blood pressure 138/80, pulse 80, temperature 99 F (37.2 C), temperature source Oral, resp. rate 18, height 5' 3" (1.6 m), weight 185 lb 9.6 oz (84.188 kg).  Physical Exam Physical Exam  Constitutional: She is oriented to person, place, and time. She appears well-developed and well-nourished.  HENT:  Head: Normocephalic and atraumatic.    Eyes: Conjunctivae normal are normal. Pupils are equal, round, and reactive to light.  Neck: Normal range of motion. Neck supple.  Cardiovascular: Normal rate and regular rhythm.   Pulmonary/Chest: Effort normal and breath sounds normal.  Abdominal: Soft. She exhibits no distension and no mass. There is tenderness (x4Qs). There is no rebound and no guarding.  Musculoskeletal: Normal range of motion.  Neurological: She is alert and oriented to person, place, and time.    Data Reviewed CT scan of abdomen pelvis reveals a right-sided colonic mass. Colonoscopy reveals colonic mass. Biopsy of  colonoscopy reveals rhonchus erosive colonic mucosa no evidence of inflammation or chronicity. There dysplasia adenomatous changes or malignancy.  Assessment    The patient is a 51-year-old female with a right-sided/transverse colon colonic mass. 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 I discussed the path report with the patient secondary to a growth and size of a colonic mass we discussed the options for surgery to essentially avoid a future obstruction. Patient understood that the mass was large enough to possibly cause obstructionin the future like to proceed with surgery at this time.    Plan    1. We'll schedule the patient for laparoscopic versus open right hemicolectomy. 2. All risks and benefits were discussed with the patient, to generally include infection, bleeding, damage to surrounding structures, and recurrence. Alternatives were offered and described.  All questions were answered and the patient voiced understanding of the procedure and wishes to proceed at this point.        Richards Pherigo Jr., Reese Senk 07/21/2012, 10:36 AM    

## 2012-08-09 NOTE — Op Note (Signed)
Pre Operative Diagnosis:  Right colonic mass  Post Operative Diagnosis: same  Procedure: Right hemicolectomy with ileocolic anastomosis  Surgeon: Dr. Axel Filler  Assistant: Dr. Abbey Chatters  Anesthesia: GETA  EBL: 75 cc  Complications: none  Counts: reported as correct x 2  Findings:  The patient had a large mass in the right proximal portion upper colon  Indications for procedure:  The patient is a 51 year old female who was recently undergoing a colonoscopic exam and was found dissection with the nerve trunk and right colon mass. Patient was then referred for imaging and elective resection of her right near obstructing colon mass.  Details of the procedure: after the patient was consented patient was taken back to the operating room patient was placed in the supine position bilateral SCDs in place. after appropriate antibiotics were confirmed a timeout was called and all facts were verified.  After general anesthesia induction patient was placed in lithotomy position.pneumoperitoneum was attained to be a variceal technique at Palmer's point the left subcostal margin. After this was obtained 5 mm trocar placed into the abdomen there was no injury to any intra-abdominal organs. There was large amount of adhesions and approximately 30-45 minutes of adhesional lysis was need to free the omental adhesions to the anterior abdominal wall.  There was a lot of adhesions to the superior and the midline previous incision sites. Once this was taken down sharply and the umbilical area free of adhesions or other trochars in place the umbilicus camera was then placed in this area and a second left lower quadrant fibers were placed for traction. At this time the right lower quadrant was freed of adhesions and 12 mm port was then placed under direct visualization. At this time were able to see the mass in the right colon. We proceeded to elevate the right colon identified the mesenteric window with  duodenum could be seen. We incised the mesentery just lateral to this area and with blunt dissection were able to proceed with a medial to lateral dissection of the right colon. This was carried down to the ileocolonic vessel once was identified this was dissected out and cleared. We used the LigaSure to come across the ileocolic vessel, it was then transected. We used an Endoloop to secure the base of the ileocolic colic vessel is hemostatic. We then carried dissection to be distal ileum in which he did complain of transection to be done. We then turned attention to the transverse colon cleaned off the omentum off the anterior aspect of the transverse colon and identified an area of in which the transverse colon to be transected. This was cleaned off circumferentially to satisfaction. We used 16 mm load stapler to transect the ileum as well as the transverse colon were previously identified. We then grasped the ends of the ileum and transverse colon with nontraumatic graspers. We proceeded to make an incision over the previous right subcostal incision approximately 4 cm in length dissection was carried down using Bovie cautery. Once we entered the abdomen where place a Alexis wound protector.  At this time it was retrieved specimen. Upon bringing up the colonic end there was a small tear that was identified and a 2-0 silk was used in a figure-of-eight fashion to tie this vessel in the mesentery. The ileum was then brought up through the wound protector and 2 stay sutures were placed on the antimesenteric side. The corners of each staple line were then cut to make entry into the bowel. A 60 mm  stapler was then used to staple across the transverse colon and the ileum to make a common channel. The staple line was hemostatic. We then closed the common defect of our anastomosis with a 60 mm staple transversely. A 3-0 Monocryl was then used at the crotch of our staple lines. There was a good anastomosis based on  palpation. This was packed into the abdomen. The fascia was then grasped by 2 Coker medial laterally. The posterior fascia was reapproximated using #1 Vicryl in a running fashion as was the anterior abdominal fascia. We then insufflated the abdomen and examined the intra-abdominal contents omentum was brought over the staple line. The which hemostasis was adequate the area was irrigated out. We then using a #0 Vicryl to reapproximate the pulmonary port site the Endo Close device. The pneumoperitoneum was then evacuated under direct visualization and the skin was positron port sites using staples. This was addressed with sterile gauze and tape. The patient's was awakened from general anesthesia and taken to recovery in stable condition

## 2012-08-09 NOTE — Anesthesia Preprocedure Evaluation (Addendum)
Anesthesia Evaluation  Patient identified by MRN, date of birth, ID band Patient awake    Reviewed: Allergy & Precautions, H&P , NPO status , Patient's Chart, lab work & pertinent test results, reviewed documented beta blocker date and time   History of Anesthesia Complications Negative for: history of anesthetic complications  Airway Mallampati: II TM Distance: >3 FB Neck ROM: Full    Dental  (+) Teeth Intact and Dental Advisory Given   Pulmonary neg pulmonary ROS,  breath sounds clear to auscultation  Pulmonary exam normal       Cardiovascular hypertension, Pt. on medications Rhythm:Regular Rate:Normal     Neuro/Psych  Headaches, negative neurological ROS     GI/Hepatic Neg liver ROS, GERD-  ,  Endo/Other  Morbid obesity  Renal/GU negative Renal ROS     Musculoskeletal   Abdominal (+) + obese,   Peds  Hematology   Anesthesia Other Findings   Reproductive/Obstetrics                         Anesthesia Physical Anesthesia Plan  ASA: II  Anesthesia Plan: General   Post-op Pain Management:    Induction: Intravenous  Airway Management Planned: Oral ETT  Additional Equipment:   Intra-op Plan:   Post-operative Plan: Extubation in OR  Informed Consent: I have reviewed the patients History and Physical, chart, labs and discussed the procedure including the risks, benefits and alternatives for the proposed anesthesia with the patient or authorized representative who has indicated his/her understanding and acceptance.   Dental advisory given  Plan Discussed with: CRNA and Surgeon  Anesthesia Plan Comments: (Plan routine monitors, GETA)        Anesthesia Quick Evaluation

## 2012-08-09 NOTE — Preoperative (Signed)
Beta Blockers   Reason not to administer Beta Blockers:Not Applicable 

## 2012-08-10 LAB — CBC
HCT: 27.5 % — ABNORMAL LOW (ref 36.0–46.0)
MCH: 27.4 pg (ref 26.0–34.0)
MCHC: 33.1 g/dL (ref 30.0–36.0)
MCV: 82.8 fL (ref 78.0–100.0)
Platelets: 200 10*3/uL (ref 150–400)
RDW: 14.2 % (ref 11.5–15.5)
WBC: 8.1 10*3/uL (ref 4.0–10.5)

## 2012-08-10 LAB — BASIC METABOLIC PANEL
BUN: 12 mg/dL (ref 6–23)
CO2: 26 mEq/L (ref 19–32)
Calcium: 8.3 mg/dL — ABNORMAL LOW (ref 8.4–10.5)
Creatinine, Ser: 0.8 mg/dL (ref 0.50–1.10)

## 2012-08-10 MED ORDER — ENOXAPARIN SODIUM 40 MG/0.4ML ~~LOC~~ SOLN
40.0000 mg | SUBCUTANEOUS | Status: DC
  Administered 2012-08-10: 40 mg via SUBCUTANEOUS
  Filled 2012-08-10 (×3): qty 0.4

## 2012-08-10 NOTE — Progress Notes (Signed)
1 Day Post-Op  Subjective: Pt doing well.  Min nausea.  OOBTC.  Objective: Vital signs in last 24 hours: Temp:  [97.4 F (36.3 C)-98.4 F (36.9 C)] 97.6 F (36.4 C) (10/16 1004) Pulse Rate:  [56-103] 89  (10/16 1004) Resp:  [13-23] 16  (10/16 1004) BP: (106-182)/(62-89) 115/68 mmHg (10/16 1004) SpO2:  [57 %-100 %] 100 % (10/16 1004) Last BM Date: 08/08/12  Intake/Output from previous day: 10/15 0701 - 10/16 0700 In: 4484 [I.V.:4484] Out: 2925 [Urine:2775; Blood:150] Intake/Output this shift:    General appearance: alert and cooperative Cardio: regular rate and rhythm, S1, S2 normal, no murmur, click, rub or gallop GI: s/approp ttp/nd/ decreased BS  Lab Results:   [facility] 08/10/12 0610 08/09/12 1514  WBC 8.1 15.8*  HGB 9.1* 12.9  HCT 27.5* 39.2  PLT 200 223   BMET  Basename 08/10/12 0610  NA 140  K 3.6  CL 105  CO2 26  GLUCOSE 170*  BUN 12  CREATININE 0.80  CALCIUM 8.3*   PT/INR No results found for this basename: LABPROT:2,INR:2 in the last 72 hours ABG No results found for this basename: PHART:2,PCO2:2,PO2:2,HCO3:2 in the last 72 hours  Studies/Results: No results found.  Anti-infectives: Anti-infectives     Start     Dose/Rate Route Frequency Ordered Stop   08/10/12 0800   ertapenem (INVANZ) 1 g in sodium chloride 0.9 % 50 mL IVPB        1 g 100 mL/hr over 30 Minutes Intravenous  Once 08/09/12 1439 08/10/12 0805   08/08/12 1450   ertapenem (INVANZ) 1 g in sodium chloride 0.9 % 50 mL IVPB        1 g 100 mL/hr over 30 Minutes Intravenous 60 min pre-op 08/08/12 1450 08/09/12 0810          Assessment/Plan: s/p Procedure(s) (LRB) with comments: LAPAROSCOPIC RIGHT HEMI COLECTOMY (Right) - laparoscopic vs open right hemi colectomy d/c foley DVT prophylaxis Await bowel function Mobilize   LOS: 1 day    Marigene Ehlers., Duston Smolenski 08/10/2012

## 2012-08-10 NOTE — Progress Notes (Signed)
MD made aware of VTE pharmacological prophylaxis.

## 2012-08-10 NOTE — Progress Notes (Signed)
Patient had a dark bloody stool, MD made aware.

## 2012-08-11 LAB — CBC
Hemoglobin: 6.4 g/dL — CL (ref 12.0–15.0)
MCH: 26.8 pg (ref 26.0–34.0)
MCHC: 32.2 g/dL (ref 30.0–36.0)
Platelets: 146 10*3/uL — ABNORMAL LOW (ref 150–400)
RDW: 14.6 % (ref 11.5–15.5)
RDW: 14.6 % (ref 11.5–15.5)
WBC: 7.9 10*3/uL (ref 4.0–10.5)

## 2012-08-11 LAB — PROTIME-INR: Prothrombin Time: 14.2 seconds (ref 11.6–15.2)

## 2012-08-11 LAB — OCCULT BLOOD X 1 CARD TO LAB, STOOL: Fecal Occult Bld: POSITIVE

## 2012-08-11 LAB — APTT: aPTT: 35 seconds (ref 24–37)

## 2012-08-11 MED ORDER — ACETAMINOPHEN 325 MG PO TABS
650.0000 mg | ORAL_TABLET | Freq: Once | ORAL | Status: AC
  Administered 2012-08-11: 650 mg via ORAL

## 2012-08-11 NOTE — Progress Notes (Signed)
2 Days Post-Op  Subjective: Nurse ask me to see, for low H/H see below, pt had a drop noted below. Doesn't really feel any worse.  Back pain uses pca about 1 hour ago. She had a loose BM in toilet, in "hat"  It looks grossly dark and bloody.  Objective: Vital signs in last 24 hours: Temp:  [97.6 F (36.4 C)-99.3 F (37.4 C)] 99.1 F (37.3 C) (10/17 0556) Pulse Rate:  [87-107] 107  (10/17 0556) Resp:  [15-19] 16  (10/17 0556) BP: (112-126)/(61-68) 119/65 mmHg (10/17 0556) SpO2:  [99 %-100 %] 100 % (10/17 0556) Last BM Date: 08/08/12  Intake/Output from previous day: 10/16 0701 - 10/17 0700 In: 2549.3 [I.V.:2549.3] Out: 575 [Urine:575] Intake/Output this shift:    General appearance: alert, cooperative and no distress GI: soft, tender post op, no distension, incisions look good, few BS.  Guiac will be + for liquid in toilet. BP:  130/74 HR 119 Lab Results:   Basename 08/11/12 0615 08/10/12 0610  WBC 8.5 8.1  HGB 6.4* 9.1*  HCT 19.9* 27.5*  PLT 166 200    BMET  Basename 08/10/12 0610  NA 140  K 3.6  CL 105  CO2 26  GLUCOSE 170*  BUN 12  CREATININE 0.80  CALCIUM 8.3*   PT/INR No results found for this basename: LABPROT:2,INR:2 in the last 72 hours  No results found for this basename: AST:5,ALT:5,ALKPHOS:5,BILITOT:5,PROT:5,ALBUMIN:5 in the last 168 hours   Lipase  No results found for this basename: lipase     Studies/Results: No results found.  Medications:    . antiseptic oral rinse  15 mL Mouth Rinse BID  . enoxaparin (LOVENOX) injection  40 mg Subcutaneous Q24H  . ertapenem  1 g Intravenous Once  . morphine   Intravenous Q4H  . pantoprazole (PROTONIX) IV  40 mg Intravenous QHS    Assessment/Plan S/p Right hemicolectomy with ileocolic anastomosis for R colon mass H/H drop Hypertension Hx of febrile non hemolytic transfusion reaction Hyperlipidemia   Plan;  Stat repeat of CBC, type and screen, PT, PTT, back in bed monitor BP.  Dr. Derrell Lolling will  see this AM and is aware.    LOS: 2 days    JENNINGS,WILLARD 08/11/2012  Agree with above.  Pt with drop in Hct.  Repeat pending currently.   Will likely require transfusion.  If pt con't to have a lowe hct or con't bloody BMs will likely need intervention in the OR to oversew staple line.  Axel Filler, MD [facility] General & Minimally Invasive Surgery Trauma & Emergency Surgery

## 2012-08-11 NOTE — Progress Notes (Signed)
Will Danwood PA notified of hgb 6.4

## 2012-08-11 NOTE — Progress Notes (Signed)
Pt.'s hgb was 8.7 tonight after 2 units PRBC transfused.  Also had another episode of bloody stool night.  VS at this time was 141/77, pulse 97.  Pt.  Dr. Derrell Lolling notified of above and orders given to continue to monitor and draw cbc in am. Jose Persia RN

## 2012-08-12 LAB — TYPE AND SCREEN
ABO/RH(D): O POS
Antibody Screen: NEGATIVE
Unit division: 0

## 2012-08-12 LAB — CBC
HCT: 28.2 % — ABNORMAL LOW (ref 36.0–46.0)
Hemoglobin: 9.3 g/dL — ABNORMAL LOW (ref 12.0–15.0)
MCH: 27.5 pg (ref 26.0–34.0)
MCHC: 33 g/dL (ref 30.0–36.0)
MCV: 83.4 fL (ref 78.0–100.0)

## 2012-08-12 MED ORDER — HYDROCODONE-ACETAMINOPHEN 5-325 MG PO TABS
1.0000 | ORAL_TABLET | ORAL | Status: DC | PRN
  Administered 2012-08-12: 1 via ORAL
  Filled 2012-08-12 (×2): qty 1

## 2012-08-12 NOTE — Progress Notes (Signed)
3 Days Post-Op  Subjective: Pt doing well.  Bloody BM noted last night.  Pt con't to feel better everyday.    Objective: Vital signs in last 24 hours: Temp:  [98.2 F (36.8 C)-99.2 F (37.3 C)] 98.5 F (36.9 C) (10/18 0622) Pulse Rate:  [82-111] 86  (10/18 0622) Resp:  [0-20] 16  (10/18 0622) BP: (100-141)/(62-77) 118/68 mmHg (10/18 0622) SpO2:  [94 %-100 %] 99 % (10/18 0622) FiO2 (%):  [96 %-98 %] 96 % (10/18 0007) Last BM Date: 08/11/12  Intake/Output from previous day: 10/17 0701 - 10/18 0700 In: 1787.5 [I.V.:1087.5; Blood:700] Out: 1450 [Urine:950; Stool:500] Intake/Output this shift: Total I/O In: 687.5 [I.V.:687.5] Out: 300 [Urine:300]  General appearance: alert and cooperative GI: soft, non-tender; bowel sounds normal; no masses,  no organomegaly  Lab Results:   Basename 08/12/12 0600 08/11/12 1945 08/11/12 0830  WBC 8.3 -- 7.9  HGB 9.3* 8.7* --  HCT 28.2* 26.0* --  PLT 166 -- 146*   BMET  Basename 08/10/12 0610  NA 140  K 3.6  CL 105  CO2 26  GLUCOSE 170*  BUN 12  CREATININE 0.80  CALCIUM 8.3*   PT/INR  Basename 08/11/12 0830  LABPROT 14.2  INR 1.11   ABG No results found for this basename: PHART:2,PCO2:2,PO2:2,HCO3:2 in the last 72 hours  Studies/Results: No results found.  Anti-infectives: Anti-infectives     Start     Dose/Rate Route Frequency Ordered Stop   08/10/12 0800   ertapenem (INVANZ) 1 g in sodium chloride 0.9 % 50 mL IVPB        1 g 100 mL/hr over 30 Minutes Intravenous  Once 08/09/12 1439 08/10/12 0805   08/08/12 1450   ertapenem (INVANZ) 1 g in sodium chloride 0.9 % 50 mL IVPB        1 g 100 mL/hr over 30 Minutes Intravenous 60 min pre-op 08/08/12 1450 08/09/12 0810          Assessment/Plan: s/p Procedure(s) (LRB) with comments: LAPAROSCOPIC RIGHT HEMI COLECTOMY (Right) - laparoscopic vs open right hemi colectomy Advance diet -con't to hold anticoagulation -mobilize in hall -con't to monitor BMs and BP/HR  LOS:  3 days    Marigene Ehlers., [place] 08/12/2012 2

## 2012-08-13 LAB — CBC
MCH: 28.1 pg (ref 26.0–34.0)
MCHC: 33.4 g/dL (ref 30.0–36.0)
MCV: 84 fL (ref 78.0–100.0)
Platelets: 216 10*3/uL (ref 150–400)
RBC: 3.49 MIL/uL — ABNORMAL LOW (ref 3.87–5.11)
RDW: 14.4 % (ref 11.5–15.5)

## 2012-08-13 MED ORDER — ACETAMINOPHEN 325 MG PO TABS
650.0000 mg | ORAL_TABLET | Freq: Four times a day (QID) | ORAL | Status: DC | PRN
  Administered 2012-08-13: 650 mg via ORAL
  Filled 2012-08-13: qty 2

## 2012-08-13 MED ORDER — HYDROCODONE-ACETAMINOPHEN 5-325 MG PO TABS
1.0000 | ORAL_TABLET | Freq: Four times a day (QID) | ORAL | Status: DC | PRN
  Administered 2012-08-14: 1 via ORAL
  Filled 2012-08-13: qty 1

## 2012-08-13 MED ORDER — ASPIRIN-ACETAMINOPHEN-CAFFEINE 250-250-65 MG PO TABS
1.0000 | ORAL_TABLET | Freq: Four times a day (QID) | ORAL | Status: DC | PRN
  Filled 2012-08-13 (×2): qty 1

## 2012-08-13 NOTE — Progress Notes (Signed)
IV team unable to start IV. Pt has maintain intake fluids throughout shift. Administered prn pain at 2059 (see mar), pt stated at 500am, she has had a headache since. Nurse and Tech both did rounds and address needs throughout the shift, pt did not make anyone aware of this. She refused the Vicodin for H/A. Pt and family member wanted the physician to be notified at that moment. Pt was sound asleep during the night on rounds.

## 2012-08-13 NOTE — Progress Notes (Signed)
4 Days Post-Op  Subjective: Pt doing well today.  Very small bloody BM.  BP HR has been fine.  PT has been ambulating well and tol diet.  Objective: Vital signs in last 24 hours: Temp:  [98 F (36.7 C)-98.8 F (37.1 C)] 98.7 F (37.1 C) (10/19 0640) Pulse Rate:  [74-90] 87  (10/19 0640) Resp:  [12-20] 16  (10/19 0640) BP: (120-159)/(59-84) 159/83 mmHg (10/19 0640) SpO2:  [94 %-100 %] 94 % (10/19 0640) Last BM Date: 08/12/12  Intake/Output from previous day: 10/18 0701 - 10/19 0700 In: 1682.5 [P.O.:720; I.V.:962.5] Out: 650 [Urine:650] Intake/Output this shift:    General appearance: alert and cooperative GI: soft, non-tender; bowel sounds normal; no masses,  no organomegaly  Lab Results:   Basename 08/12/12 0600 08/11/12 1945 08/11/12 0830  WBC 8.3 -- 7.9  HGB 9.3* 8.7* --  HCT 28.2* 26.0* --  PLT 166 -- 146*   BMET No results found for this basename: NA:2,K:2,CL:2,CO2:2,GLUCOSE:2,BUN:2,CREATININE:2,CALCIUM:2 in the last 72 hours PT/INR  Basename 08/11/12 0830  LABPROT 14.2  INR 1.11   ABG No results found for this basename: PHART:2,PCO2:2,PO2:2,HCO3:2 in the last 72 hours  Studies/Results: No results found.  Anti-infectives: Anti-infectives     Start     Dose/Rate Route Frequency Ordered Stop   08/10/12 0800   ertapenem (INVANZ) 1 g in sodium chloride 0.9 % 50 mL IVPB        1 g 100 mL/hr over 30 Minutes Intravenous  Once 08/09/12 1439 08/10/12 0805   08/08/12 1450   ertapenem (INVANZ) 1 g in sodium chloride 0.9 % 50 mL IVPB        1 g 100 mL/hr over 30 Minutes Intravenous 60 min pre-op 08/08/12 1450 08/09/12 0810          Assessment/Plan: s/p Procedure(s) (LRB) with comments: LAPAROSCOPIC RIGHT HEMI COLECTOMY (Right) - laparoscopic vs open right hemi colectomy Advance diet -Mobilize -f/u CBC -home in AM if con't to do well.   LOS: 4 days    Marigene Ehlers., [facility] 08/13/2012

## 2012-08-14 DIAGNOSIS — R609 Edema, unspecified: Secondary | ICD-10-CM

## 2012-08-14 MED ORDER — HYDROCODONE-ACETAMINOPHEN 5-325 MG PO TABS: 1.0000 | ORAL_TABLET | Freq: Four times a day (QID) | ORAL | Status: DC | PRN

## 2012-08-14 NOTE — Progress Notes (Signed)
VASCULAR LAB PRELIMINARY  PRELIMINARY  PRELIMINARY  PRELIMINARY  Bilateral lower extremity venous Dopplers completed.    Preliminary report:  There is no DVT or SVT noted in the bilateral lower extremities.    Renee Mills, 08/14/2012, 9:44 AM

## 2012-08-14 NOTE — Discharge Summary (Signed)
Physician Discharge Summary  Patient ID: Renee Mills MRN: [id number] DOB/AGE: [birth year] 52 y.o.  Admit date: 08/09/2012 Discharge date: 08/14/2012  Admission Diagnoses: R colon Mass  Discharge Diagnoses: S/p lap R colon resection Active Problems:  * No active hospital problems. *    Discharged Condition: good  Hospital Course: The patient had her R colon surgery on 08/09/2012.  Please see notes for full details.  Post op the patietn was sent to the floor.  Pain control was good.  Pt had LGIB on POD #1 wwhich con't un til POD #3.  Hct had decreased and pt was transfused PRBCs x2.  Her BP and HR were stable throughout this.  She eventually had a normal BM without blood.  Pt was otherwise ambulating well on her own.  Had normal and stable HCT.  She was afebrile, deems stabel for DC and Dc'd home  Consults: None  Significant Diagnostic Studies: labs: HCT  Treatments: blood transfusion x 2 PRBCs  Discharge Exam: Blood pressure 136/82, pulse 80, temperature 97.8 F (36.6 C), temperature source Oral, resp. rate 18, height 5' 2.5" (1.588 m), weight 181 lb (82.101 kg), SpO2 96.00%. General appearance: alert and cooperative Head: Normocephalic, without obvious abnormality, atraumatic Cardio: regular rate and rhythm, S1, S2 normal, no murmur, click, rub or gallop GI: soft, non-tender; bowel sounds normal; no masses,  no organomegaly wound C/d/i  Disposition:      Medication List     As of 08/14/2012  7:32 AM    ASK your doctor about these medications         acyclovir 400 MG tablet   Commonly known as: ZOVIRAX   Take 400 mg by mouth every 4 (four) hours while awake.      aspirin-acetaminophen-caffeine 250-250-65 MG per tablet   Commonly known as: EXCEDRIN MIGRAINE   Take 1 tablet by mouth every 6 (six) hours as needed. For headache      hyoscyamine 0.125 MG Tbdp   Commonly known as: ANASPAZ   Place 0.125 mg under the tongue every 6 (six) hours as needed. For stomach spasms       losartan-hydrochlorothiazide 100-12.5 MG per tablet   Commonly known as: HYZAAR   Take 1 tablet by mouth daily.      naproxen sodium 220 MG tablet   Commonly known as: ANAPROX   Take 220 mg by mouth 2 (two) times daily with a meal.      Vitamin D3 1000 UNITS Caps   Take 1,000 Units by mouth daily.           Follow-up Information    Follow up with Lajean Saver, MD. Schedule an appointment as soon as possible for a visit in 2 weeks.   Contact information:   [address] [place] [address] [phone]          Signed: Marigene Ehlers., Jed Limerick 08/14/2012, 7:32 AM

## 2012-08-14 NOTE — Progress Notes (Signed)
Discharge instructions gone over with patient. Duplex studies were negative for dvt. Home medications gone over. Prescription given. Patient to make follow up appointment with surgeon. Activity, diet,  and incisional care gone over. Patient verbalized when to call the doctor. No questions at this time.

## 2012-08-17 ENCOUNTER — Telehealth (INDEPENDENT_AMBULATORY_CARE_PROVIDER_SITE_OTHER): Payer: Self-pay | Admitting: General Surgery

## 2012-08-17 ENCOUNTER — Telehealth (INDEPENDENT_AMBULATORY_CARE_PROVIDER_SITE_OTHER): Payer: Self-pay

## 2012-08-17 NOTE — Telephone Encounter (Signed)
Patient called in with swelling of ankles since last night. She states she put legs on a pillow to elevate them a little last night but she woke up this morning and they were still swollen. I paged Dr. Derrell Lolling and he said to have elevate legs above the heart and see if swelling gets better. Patient will do as instructed and call us back at lunch to let us know how her legs are. If no better I told her Derrell Lolling is in office this afternoon and we can have her come in to see him.

## 2012-08-17 NOTE — Telephone Encounter (Signed)
Called Mrs Ruggiero back and checked on her and see how her ankle were and she stated that they where a little better since she has kept them elevated above her heart. She said that she will ok coming in tomorrow to get her staples removed and I also told her that we have someone here all the time when the office is closed.

## 2012-08-18 ENCOUNTER — Ambulatory Visit (INDEPENDENT_AMBULATORY_CARE_PROVIDER_SITE_OTHER): Payer: Managed Care, Other (non HMO) | Admitting: General Surgery

## 2012-08-18 ENCOUNTER — Encounter (INDEPENDENT_AMBULATORY_CARE_PROVIDER_SITE_OTHER): Payer: Self-pay | Admitting: General Surgery

## 2012-08-18 VITALS — BP 143/86 | HR 80 | Temp 98.3°F | Resp 16 | Ht 62.5 in | Wt 183.6 lb

## 2012-08-18 DIAGNOSIS — Z9889 Other specified postprocedural states: Secondary | ICD-10-CM

## 2012-08-18 NOTE — Progress Notes (Signed)
Patient ID: Renee Mills, female   DOB: [birth year], 51 y.o.   MRN: [id number] This patient is a 51 year old female status post right hemicolectomy. He has been doing well postoperatively. Patient call stating that she did have some bilateral lower from theedema. Patient had this same complaint prior to discharge and had an ultrasound of her bilateral lower extremities which was negative for DVT. He has had some pain in her right side of her abdomen which is normal. Patient has been admitted in some elevating her leg at night to help with closure of edema.  On exam: Clean dry and intact staples are removed.  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 Final pathology report pending  Assessment and plan: 1. Patient continue with angulation and activity as tolerated. I first patient to ambulate as much possible.  2 we remove her staples in the office at this time for surgery. 3. We addressed her sleepiness today time as well as her change in taste food which is normal at this time postoperatively and will resolve. 4. We'll say outpatient followup in one month for wound check, she can call for an further questions prior to that.

## 2012-08-19 ENCOUNTER — Encounter (INDEPENDENT_AMBULATORY_CARE_PROVIDER_SITE_OTHER): Payer: Managed Care, Other (non HMO) | Admitting: General Surgery

## 2012-08-25 ENCOUNTER — Ambulatory Visit
Admission: RE | Admit: 2012-08-25 | Discharge: 2012-08-25 | Disposition: A | Discharge status: Home / Self Care | Payer: Managed Care, Other (non HMO) | Source: Ambulatory Visit | Attending: Family Medicine | Admitting: Family Medicine

## 2012-08-25 DIAGNOSIS — R928 Other abnormal and inconclusive findings on diagnostic imaging of breast: Secondary | ICD-10-CM

## 2012-09-01 ENCOUNTER — Encounter (INDEPENDENT_AMBULATORY_CARE_PROVIDER_SITE_OTHER): Payer: Managed Care, Other (non HMO) | Admitting: General Surgery

## 2012-09-15 ENCOUNTER — Ambulatory Visit (INDEPENDENT_AMBULATORY_CARE_PROVIDER_SITE_OTHER): Payer: Managed Care, Other (non HMO) | Admitting: General Surgery

## 2012-09-15 ENCOUNTER — Encounter (INDEPENDENT_AMBULATORY_CARE_PROVIDER_SITE_OTHER): Payer: Self-pay | Admitting: General Surgery

## 2012-09-15 VITALS — BP 137/84 | HR 82 | Temp 98.5°F | Resp 14 | Ht 62.0 in | Wt 184.2 lb

## 2012-09-15 DIAGNOSIS — Z9889 Other specified postprocedural states: Secondary | ICD-10-CM

## 2012-09-15 DIAGNOSIS — Z9049 Acquired absence of other specified parts of digestive tract: Secondary | ICD-10-CM

## 2012-09-15 NOTE — Progress Notes (Signed)
Patient ID: Renee Mills, female   DOB: 02-06-1961, 51 y.o.   MRN: [id number] The patient is a 51 year old female status post right hemicolectomy for a right near obstructing lipoma. Patient has been doing well postoperatively. He eating well has had some loose bowel movements. Patient does complain of some right subcostal pain with breathing and with sitting for long periods of time. The patient had a previous right subcostal for liver surgery and pain is inthat area.  On exam: Wounds are clean dry and intact and healed.  We'll have the patient followup in 2 months to assess for probability. Patient will likely need more time off from work secondary to her work environment. Patient and 5 mg for muscle relaxer down to her pain and discomfort also in sitting for long periods of time.

## 2012-10-05 ENCOUNTER — Other Ambulatory Visit: Payer: Self-pay | Admitting: Family Medicine

## 2012-10-05 DIAGNOSIS — IMO0002 Reserved for concepts with insufficient information to code with codable children: Secondary | ICD-10-CM

## 2012-10-06 ENCOUNTER — Ambulatory Visit
Admission: RE | Admit: 2012-10-06 | Discharge: 2012-10-06 | Disposition: A | Discharge status: Home / Self Care | Payer: Managed Care, Other (non HMO) | Source: Ambulatory Visit | Attending: Family Medicine | Admitting: Family Medicine

## 2012-10-06 DIAGNOSIS — IMO0002 Reserved for concepts with insufficient information to code with codable children: Secondary | ICD-10-CM

## 2012-10-24 ENCOUNTER — Telehealth (INDEPENDENT_AMBULATORY_CARE_PROVIDER_SITE_OTHER): Payer: Self-pay | Admitting: General Surgery

## 2012-10-24 NOTE — Telephone Encounter (Signed)
Spoke with pt and informed her that Dr. Derrell Lolling said she should be okay to use OTC pain medications like ibuprofen, tylenol, etc. To help manage pain.  I also explained I would be happy to give her letter for light duty for work.  She explained that she was going to call her HR department tomorrow 12/31 and have them re-submit her FMLA or short term forms.  I informed her that Dr. Derrell Lolling was out of the office this week so the soonest he would be able to sign off on the updated forms would be next week.  She said she understood.

## 2012-10-24 NOTE — Telephone Encounter (Signed)
Pt called in explaining that she is supposed to return to work on 10/27/12 at her BM are still not normal.  I explained that after a surgery like that her BM will not go completely back to normal but that it will take several months to go adjusted and that her body will find a new normal.  Explained that she should be having at least one soft bowel movement a day.  She said she was doing that.  She did want to know if she should still plan on returning to work on 10/27/12 because she has sever pain after sitting for more than 4 hours in ana upright position and she explained she does not have the ability to get up and move around at her job.  She also explained that she needs a different pain control method suggestion because she cannot work while on the narcotics.  Explained I would send this message to Dr. Derrell Lolling and get his suggestion and then we would call her back.

## 2012-11-17 ENCOUNTER — Encounter (INDEPENDENT_AMBULATORY_CARE_PROVIDER_SITE_OTHER): Payer: Managed Care, Other (non HMO) | Admitting: General Surgery

## 2014-03-09 ENCOUNTER — Other Ambulatory Visit: Payer: Self-pay | Admitting: Gastroenterology

## 2014-03-09 DIAGNOSIS — R109 Unspecified abdominal pain: Secondary | ICD-10-CM

## 2014-03-15 ENCOUNTER — Ambulatory Visit
Admission: RE | Admit: 2014-03-15 | Discharge: 2014-03-15 | Disposition: A | Discharge status: Home / Self Care | Payer: Managed Care, Other (non HMO) | Source: Ambulatory Visit | Attending: Gastroenterology | Admitting: Gastroenterology

## 2014-03-15 DIAGNOSIS — R109 Unspecified abdominal pain: Secondary | ICD-10-CM

## 2014-03-15 MED ORDER — IOHEXOL 300 MG/ML  SOLN
125.0000 mL | Freq: Once | INTRAMUSCULAR | Status: AC | PRN
  Administered 2014-03-15: 125 mL via INTRAVENOUS

## 2014-10-14 ENCOUNTER — Encounter (HOSPITAL_COMMUNITY): Payer: Self-pay | Admitting: Emergency Medicine

## 2014-10-14 ENCOUNTER — Observation Stay (HOSPITAL_COMMUNITY)
Admission: EM | Admit: 2014-10-14 | Discharge: 2014-10-15 | Disposition: A | Discharge status: Home / Self Care | Payer: 59 | Attending: Internal Medicine | Admitting: Internal Medicine

## 2014-10-14 ENCOUNTER — Observation Stay (HOSPITAL_COMMUNITY): Payer: 59

## 2014-10-14 ENCOUNTER — Emergency Department (HOSPITAL_COMMUNITY): Payer: 59

## 2014-10-14 DIAGNOSIS — R112 Nausea with vomiting, unspecified: Secondary | ICD-10-CM | POA: Insufficient documentation

## 2014-10-14 DIAGNOSIS — R202 Paresthesia of skin: Secondary | ICD-10-CM

## 2014-10-14 DIAGNOSIS — R200 Anesthesia of skin: Secondary | ICD-10-CM | POA: Diagnosis not present

## 2014-10-14 DIAGNOSIS — I10 Essential (primary) hypertension: Secondary | ICD-10-CM | POA: Diagnosis present

## 2014-10-14 DIAGNOSIS — R079 Chest pain, unspecified: Secondary | ICD-10-CM | POA: Diagnosis present

## 2014-10-14 DIAGNOSIS — E785 Hyperlipidemia, unspecified: Secondary | ICD-10-CM | POA: Diagnosis not present

## 2014-10-14 DIAGNOSIS — G459 Transient cerebral ischemic attack, unspecified: Secondary | ICD-10-CM

## 2014-10-14 DIAGNOSIS — R29898 Other symptoms and signs involving the musculoskeletal system: Secondary | ICD-10-CM | POA: Diagnosis present

## 2014-10-14 LAB — RAPID URINE DRUG SCREEN, HOSP PERFORMED
Amphetamines: NOT DETECTED
BENZODIAZEPINES: NOT DETECTED
Barbiturates: NOT DETECTED
Cocaine: NOT DETECTED
OPIATES: NOT DETECTED
Tetrahydrocannabinol: NOT DETECTED

## 2014-10-14 LAB — COMPREHENSIVE METABOLIC PANEL
ALBUMIN: 4.1 g/dL (ref 3.5–5.2)
ALT: 18 U/L (ref 0–35)
AST: 25 U/L (ref 0–37)
Alkaline Phosphatase: 60 U/L (ref 39–117)
Anion gap: 13 (ref 5–15)
BUN: 11 mg/dL (ref 6–23)
CALCIUM: 9.5 mg/dL (ref 8.4–10.5)
CO2: 25 mEq/L (ref 19–32)
Chloride: 102 mEq/L (ref 96–112)
Creatinine, Ser: 0.66 mg/dL (ref 0.50–1.10)
GFR calc non Af Amer: >90 mL/min (ref >90)
Glucose, Bld: 95 mg/dL (ref 70–99)
Potassium: 4.4 mEq/L (ref 3.7–5.3)
Sodium: 140 mEq/L (ref 137–147)
Total Bilirubin: 0.6 mg/dL (ref 0.3–1.2)
Total Protein: 7.8 g/dL (ref 6.0–8.3)

## 2014-10-14 LAB — DIFFERENTIAL
BASOS ABS: 0.1 10*3/uL (ref 0.0–0.1)
Basophils Relative: 1 % (ref 0–1)
EOS PCT: 1 % (ref 0–5)
Eosinophils Absolute: 0.1 10*3/uL (ref 0.0–0.7)
Lymphocytes Relative: 29 % (ref 12–46)
Lymphs Abs: 2.8 10*3/uL (ref 0.7–4.0)
Monocytes Absolute: 0.5 10*3/uL (ref 0.1–1.0)
Monocytes Relative: 5 % (ref 3–12)
Neutro Abs: 6.3 10*3/uL (ref 1.7–7.7)
Neutrophils Relative %: 64 % (ref 43–77)

## 2014-10-14 LAB — PROTIME-INR
INR: 0.96 (ref 0.00–1.49)
Prothrombin Time: 12.9 seconds (ref 11.6–15.2)

## 2014-10-14 LAB — URINALYSIS, ROUTINE W REFLEX MICROSCOPIC
Bilirubin Urine: NEGATIVE
GLUCOSE, UA: NEGATIVE mg/dL
HGB URINE DIPSTICK: NEGATIVE
Ketones, ur: NEGATIVE mg/dL
Leukocytes, UA: NEGATIVE
Nitrite: NEGATIVE
Protein, ur: NEGATIVE mg/dL
Specific Gravity, Urine: 1.024 (ref 1.005–1.030)
Urobilinogen, UA: 0.2 mg/dL (ref 0.0–1.0)
pH: 5 (ref 5.0–8.0)

## 2014-10-14 LAB — CBC
HEMATOCRIT: 45.1 % (ref 36.0–46.0)
HEMOGLOBIN: 14.4 g/dL (ref 12.0–15.0)
MCH: 27.2 pg (ref 26.0–34.0)
MCHC: 31.9 g/dL (ref 30.0–36.0)
MCV: 85.3 fL (ref 78.0–100.0)
Platelets: 231 10*3/uL (ref 150–400)
RBC: 5.29 MIL/uL — AB (ref 3.87–5.11)
RDW: 14.7 % (ref 11.5–15.5)
WBC: 9.7 10*3/uL (ref 4.0–10.5)

## 2014-10-14 LAB — I-STAT TROPONIN, ED: Troponin i, poc: 0.01 ng/mL (ref 0.00–0.08)

## 2014-10-14 LAB — APTT: APTT: 30 s (ref 24–37)

## 2014-10-14 LAB — TROPONIN I: Troponin I: <0.3 ng/mL (ref <0.30)

## 2014-10-14 MED ORDER — ASPIRIN 325 MG PO TABS
325.0000 mg | ORAL_TABLET | Freq: Every day | ORAL | Status: DC
  Administered 2014-10-15: 325 mg via ORAL
  Filled 2014-10-14 (×2): qty 1

## 2014-10-14 MED ORDER — SODIUM CHLORIDE 0.9 % IJ SOLN
3.0000 mL | Freq: Two times a day (BID) | INTRAMUSCULAR | Status: DC
Start: 2014-10-14 — End: 2014-10-15
  Administered 2014-10-15 (×2): 3 mL via INTRAVENOUS

## 2014-10-14 MED ORDER — ONDANSETRON HCL 4 MG PO TABS: 4.0000 mg | ORAL_TABLET | Freq: Four times a day (QID) | ORAL | Status: DC | PRN

## 2014-10-14 MED ORDER — ACETAMINOPHEN 325 MG PO TABS
650.0000 mg | ORAL_TABLET | Freq: Four times a day (QID) | ORAL | Status: DC | PRN
  Administered 2014-10-15: 650 mg via ORAL
  Filled 2014-10-14: qty 2

## 2014-10-14 MED ORDER — ONDANSETRON HCL 4 MG/2ML IJ SOLN
4.0000 mg | Freq: Four times a day (QID) | INTRAMUSCULAR | Status: DC | PRN
  Filled 2014-10-14: qty 2

## 2014-10-14 MED ORDER — HYDROCHLOROTHIAZIDE 12.5 MG PO CAPS
12.5000 mg | ORAL_CAPSULE | Freq: Every day | ORAL | Status: DC
  Administered 2014-10-15: 12.5 mg via ORAL
  Filled 2014-10-14: qty 1

## 2014-10-14 MED ORDER — ENOXAPARIN SODIUM 40 MG/0.4ML ~~LOC~~ SOLN
40.0000 mg | SUBCUTANEOUS | Status: DC
  Administered 2014-10-15: 40 mg via SUBCUTANEOUS
  Filled 2014-10-14: qty 0.4

## 2014-10-14 MED ORDER — NITROGLYCERIN 2 % TD OINT
1.0000 [in_us] | TOPICAL_OINTMENT | Freq: Four times a day (QID) | TRANSDERMAL | Status: DC
  Administered 2014-10-14 – 2014-10-15 (×2): 1 [in_us] via TOPICAL
  Filled 2014-10-14 (×2): qty 30

## 2014-10-14 MED ORDER — SODIUM CHLORIDE 0.9 % IJ SOLN
3.0000 mL | Freq: Two times a day (BID) | INTRAMUSCULAR | Status: DC
  Administered 2014-10-15: 3 mL via INTRAVENOUS

## 2014-10-14 MED ORDER — STROKE: EARLY STAGES OF RECOVERY BOOK
Freq: Once | Status: DC
  Filled 2014-10-14 (×2): qty 1

## 2014-10-14 MED ORDER — SODIUM CHLORIDE 0.9 % IV SOLN: 250.0000 mL | INTRAVENOUS | Status: DC | PRN

## 2014-10-14 MED ORDER — OXYCODONE HCL 5 MG PO TABS: 5.0000 mg | ORAL_TABLET | ORAL | Status: DC | PRN

## 2014-10-14 MED ORDER — ASPIRIN 81 MG PO CHEW
324.0000 mg | CHEWABLE_TABLET | Freq: Once | ORAL | Status: AC
  Administered 2014-10-14: 324 mg via ORAL
  Filled 2014-10-14: qty 4

## 2014-10-14 MED ORDER — SODIUM CHLORIDE 0.9 % IJ SOLN: 3.0000 mL | INTRAMUSCULAR | Status: DC | PRN

## 2014-10-14 MED ORDER — ACETAMINOPHEN 650 MG RE SUPP: 650.0000 mg | Freq: Four times a day (QID) | RECTAL | Status: DC | PRN

## 2014-10-14 MED ORDER — ONDANSETRON HCL 4 MG/2ML IJ SOLN: 4.0000 mg | Freq: Three times a day (TID) | INTRAMUSCULAR | Status: DC | PRN

## 2014-10-14 MED ORDER — LOSARTAN POTASSIUM 50 MG PO TABS
100.0000 mg | ORAL_TABLET | Freq: Every day | ORAL | Status: DC
  Administered 2014-10-15: 100 mg via ORAL
  Filled 2014-10-14: qty 2

## 2014-10-14 MED ORDER — VITAMIN D 1000 UNITS PO TABS
1000.0000 [IU] | ORAL_TABLET | Freq: Every day | ORAL | Status: DC
  Administered 2014-10-15: 1000 [IU] via ORAL
  Filled 2014-10-14 (×2): qty 1

## 2014-10-14 MED ORDER — ALUM & MAG HYDROXIDE-SIMETH 200-200-20 MG/5ML PO SUSP: 30.0000 mL | Freq: Four times a day (QID) | ORAL | Status: DC | PRN

## 2014-10-14 MED ORDER — HYDROMORPHONE HCL 1 MG/ML IJ SOLN: 0.5000 mg | INTRAMUSCULAR | Status: DC | PRN

## 2014-10-14 MED ORDER — LOSARTAN POTASSIUM-HCTZ 100-12.5 MG PO TABS: 1.0000 | ORAL_TABLET | Freq: Every day | ORAL | Status: DC

## 2014-10-14 NOTE — ED Notes (Signed)
Called [place] and relayed that [facility] is 5 min out from [employer].

## 2014-10-14 NOTE — H&P (Signed)
[facility] Admission History and Physical       Renee Mills TIR:[id number] DOB: [birth year] DOA: 10/14/2014  Referring physician: EDP PCP: Marjorie Smolder, MD  Specialists:   Chief Complaint: Chest Pain, Weakness and Numbness Right Arm  HPI: Renee Mills is a 53 y.o. female with a history of HTN, and Hyperlipidemia whop presents to the ED with complaints of Feeling bad for the past 48 hours .  She reports that she had been traveling to [facility] and back and has had intermittent  Right sided chest pain with SOB and nausea and vomiting.   She also reports feeling as if she would pass out, and she has had intermittent paresthesias of her right arm and right arm weakness for the past 3 days.   When she was traveling back to [place] today she told her husband to take her to the hospital because she felt so bad.   In the ED a Ct scan of her brain was performed which was negative for acute findings, and the initial cardia workup has been negative x 1.   She is being transferred to [facility] for further evaluation.      Review of Systems:  Constitutional: No Weight Loss, No Weight Gain, Night Sweats, Fevers, Chills, Dizziness, Fatigue, or Generalized Weakness HEENT: No Headaches, Difficulty Swallowing,Tooth/Dental Problems,Sore Throat,  No Sneezing, Rhinitis, Ear Ache, Nasal Congestion, or Post Nasal Drip,  Cardio-vascular:  +Chest pain, Orthopnea, PND, Edema in Lower Extremities, Anasarca, Dizziness, Palpitations  Resp: +Dyspnea, No DOE, No Productive Cough, No Non-Productive Cough, No Hemoptysis, No Wheezing.    GI: No Heartburn, Indigestion, Abdominal Pain, +Nausea, +Vomiting, Diarrhea, Hematemesis, Hematochezia, Melena, Change in Bowel Habits,  Loss of Appetite  GU: No Dysuria, Change in Color of Urine, No Urgency or Frequency, No Flank pain.  Musculoskeletal: No Joint Pain or Swelling, No Decreased Range of Motion, No Back Pain.  Neurologic: + Near Syncope, No Seizures, +Right  Arm Muscle Weakness, +Right Arm Paresthesia, Vision Disturbance or Loss, No Diplopia, No Vertigo, No Difficulty Walking,  Skin: No Rash or Lesions. Psych: No Change in Mood or Affect, No Depression or Anxiety, No Memory loss, No Confusion, or Hallucinations   Past Medical History  Diagnosis Date  . Hyperlipidemia   . Hypertension   . Genital herpes   . FNHTR (febrile nonhemolytic transfusion reaction)   . Vitamin deficiency   . Arthritis   . Pneumonia     hx of   . Bronchitis     hx of  . Urinary (tract) obstruction     hx of  . Headache(784.0)     migraines  . Anemia     hx of      Past Surgical History  Procedure Laterality Date  . Cesarean section    . Abdominal hysterectomy    . Cholecystectomy    . Hammer toe surgery  2000    both feet  . Liver surgery    . Hyperplasia tissue excision    . Cardiovascular stress test      2000  . Doppler echocardiography      2000       Prior to Admission medications   Medication Sig Start Date End Date Taking? Authorizing Provider  acyclovir (ZOVIRAX) 400 MG tablet Take 400 mg by mouth every 4 (four) hours as needed (genital warts).    Yes Historical Provider, MD  aspirin-acetaminophen-caffeine (EXCEDRIN MIGRAINE) [phone] MG per tablet Take 1 tablet by mouth every 6 (six) hours as needed for  headache (headache). For headache   Yes Historical Provider, MD  Cholecalciferol (VITAMIN D3) 1000 UNITS CAPS Take 1,000 Units by mouth daily.    Yes Historical Provider, MD  Dicyclomine HCl (BENTYL PO) Take 1 tablet by mouth daily as needed (stomach pain).   Yes Historical Provider, MD  DULoxetine HCl (CYMBALTA PO) Take 1 capsule by mouth daily as needed (depression).   Yes Historical Provider, MD  losartan-hydrochlorothiazide (HYZAAR) 100-12.5 MG per tablet Take 1 tablet by mouth daily.   Yes Historical Provider, MD  HYDROcodone-acetaminophen (NORCO/VICODIN) 5-325 MG per tablet Take 1 tablet by mouth every 6 (six) hours as needed.  08/14/12   Ralene Ok, MD  hyoscyamine (ANASPAZ) 0.125 MG TBDP Place 0.125 mg under the tongue every 6 (six) hours as needed. For stomach spasms    Historical Provider, MD      Allergies  Allergen Reactions  . Coconut Fatty Acids Nausea Only  . Lisinopril     cough     Social History:  reports that she has never smoked. She does not have any smokeless tobacco history on file. She reports that she drinks alcohol. She reports that she does not use illicit drugs.     History reviewed. No pertinent family history.     Physical Exam:  GEN:  Pleasant Obese 53 y.o. African American female examined and in no acute distress; cooperative with exam Filed Vitals:   10/14/14 1504 10/14/14 1721  BP: 149/99 145/88  Pulse: 96 87  Temp: 98 F (36.7 C) 98.2 F (36.8 C)  TempSrc: Oral Oral  Resp: 22 14  SpO2: 99% 97%   Blood pressure 145/88, pulse 87, temperature 98.2 F (36.8 C), temperature source Oral, resp. rate 14, SpO2 97 %. PSYCH: She is alert and oriented x4; does not appear anxious does not appear depressed; affect is normal HEENT: Normocephalic and Atraumatic, Mucous membranes pink; PERRLA; EOM intact; Fundi:  Benign;  No scleral icterus, Nares: Patent, Oropharynx: Clear, Fair Dentition,    Neck:  FROM, No Cervical Lymphadenopathy nor Thyromegaly or Carotid Bruit; No JVD; Breasts:: Not examined CHEST WALL: No tenderness CHEST: Normal respiration, clear to auscultation bilaterally HEART: Regular rate and rhythm; no murmurs rubs or gallops BACK: No kyphosis or scoliosis; No CVA tenderness ABDOMEN: Positive Bowel Sounds, Obese, Soft Non-Tender; No Masses, No Organomegaly, No Pannus; No Intertriginous candida. Rectal Exam: Not done EXTREMITIES: No Cyanosis, Clubbing, or Edema; No Ulcerations. Genitalia: not examined PULSES: 2+ and symmetric SKIN: Normal hydration no rash or ulceration  CNS:   Mental Status:  Alert, Oriented, Thought Content Appropriate. Speech Fluent  without evidence of Aphasia. Able to follow 3 step commands without difficulty.  In No obvious pain.   Cranial Nerves:  II: Discs flat bilaterally; Visual fields Intact, Pupils equal and reactive.    III,IV, VI: Extra-ocular motions intact bilaterally    V,VII: smile symmetric, facial light touch sensation normal bilaterally    VIII: hearing intact bilaterally    IX,X: gag reflex present    XI: bilateral shoulder shrug    XII: midline tongue extension   Motor:  Right:  Upper extremity 5/5     Left:  Upper extremity 5/5     Right:  Lower extremity 5/5    Left:  Lower extremity 5/5     Tone and Bulk:  normal tone throughout; no atrophy noted   Sensory:  Pinprick and light touch intact throughout, bilaterally   Deep Tendon Reflexes: 2+ and symmetric throughout   Plantars/ Babinski:  Right: normal Left: normal    Cerebellar:  Finger to nose without difficulty.   Gait: deferred   Vascular: pulses palpable throughout    Labs on Admission:  Basic Metabolic Panel:  Recent Labs Lab 10/14/14 1657  NA 140  K 4.4  CL 102  CO2 25  GLUCOSE 95  BUN 11  CREATININE 0.66  CALCIUM 9.5   Liver Function Tests:  Recent Labs Lab 10/14/14 1657  AST 25  ALT 18  ALKPHOS 60  BILITOT 0.6  PROT 7.8  ALBUMIN 4.1   No results for input(s): LIPASE, AMYLASE in the last 168 hours. No results for input(s): AMMONIA in the last 168 hours. CBC:  Recent Labs Lab 10/14/14 1657  WBC 9.7  NEUTROABS 6.3  HGB 14.4  HCT 45.1  MCV 85.3  PLT 231   Cardiac Enzymes: No results for input(s): CKTOTAL, CKMB, CKMBINDEX, TROPONINI in the last 168 hours.  BNP (last 3 results) No results for input(s): PROBNP in the last 8760 hours. CBG: No results for input(s): GLUCAP in the last 168 hours.  Radiological Exams on Admission: Dg Chest 2 View  10/14/2014   CLINICAL DATA:  Right-sided chest pain and dizziness.  EXAM: CHEST  2 VIEW  COMPARISON:  Chest radiograph 01/15/2011  FINDINGS: Multiple leads  overlie the patient. Stable enlarged cardiac and mediastinal contours. Eventration right hemidiaphragm. No large consolidative pulmonary opacities. No pleural effusion or pneumothorax. Right upper quadrant surgical clips. Mid thoracic spine degenerative change.  IMPRESSION: No acute cardiopulmonary process.   Electronically Signed   By: Lovey Newcomer M.D.   On: 10/14/2014 17:26   Ct Head Wo Contrast  10/14/2014   CLINICAL DATA:  Right arm numbness and chest pain shows the right arm numbness and right chest pain for a few days. Dizziness and confusion beginning today. Initial encounter.  EXAM: CT HEAD WITHOUT CONTRAST  TECHNIQUE: Contiguous axial images were obtained from the base of the skull through the vertex without intravenous contrast.  COMPARISON:  None.  FINDINGS: The brain appears normal without hemorrhage, infarct, mass lesion, mass effect, midline shift or abnormal extra-axial fluid collection no hydrocephalus or pneumocephalus. The calvarium is intact. Imaged paranasal sinuses and mastoid air cells are clear.  IMPRESSION: Negative head CT.   Electronically Signed   By: Inge Rise M.D.   On: 10/14/2014 17:15     EKG: Independently reviewed. Normal Sinus Rhythm rate 92, No Acute Changes. 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 female with  Principal Problem:   1.    Chest pain at rest   Telemetry Bed   Cycle Troponins   Nitropaste, O2, ASA   Continue Losartan/HCTZ  Active Problems:   2.    Arm weakness- CVA vs TIA Workup   Telemetry Monitoring   Neuro Checks   MRI/MRA Brain, Carotid US, and 2D ECHO in AM   Fasting Lipids, HbA1C in AM   ASA Rx         3.    Essential hypertension   Continue Losartan/HCTZ   Monitor BPs       4.    Hyperlipidemia   Check Fasting Lipids in AM     5.    DVT Prophylaxis    Lovenox     Code Status:      FULL CODE Family Communication:    Son at Bedside Disposition Plan:       Observation Telemetry  Bed at [facility]  Time spent: [facility] Pager [phone]   If [place] Please Contact the Day Rounding Team MD for [facility]  If 7PM-7AM, Please Contact Night-Floor Coverage  [email or web address] Password [facility] 10/14/2014, 7:29 PM

## 2014-10-14 NOTE — ED Provider Notes (Signed)
Complains of numbness at right forearm and fingertips of right hand onset 3 days ago. She also complains of intermittent chest pain since yesterday morning. Right-sided parasternal nonradiating not made better or worse by anything. Patient also reports she complained of nausea and vomiting from 11 AM until 2 PM yesterday she vomited 3 or 4 times. Not nauseated presently. No treatment prior to coming here. Other symptoms include lightheadedness and difficulty concentrating. No other associated symptoms. No treatment prior to coming here. Nothing makes symptoms better or worse. Cardiac risk factors include hypertension, hypercholesterolemia on exam no distress alert Glasgow Coma Score 15 HEENT exam no facial asymmetry neck supple no bruit heart regular rate and rhythm no murmurs abdomen obese nontender all 4 extremity is without redness swelling or tenderness neurovascularly intact neurologic Glasgow Coma Score 15 gait normal Romberg normal pronator drift normal DTR symmetric bilaterally at knee jerk ankle jerk biceps toes downward going bilaterally finger to nose normal Chest pain is atypical. She has a nonfocal neurologic exam.Heart score =3    Orlie Dakin, MD 10/14/14 1859

## 2014-10-14 NOTE — ED Provider Notes (Signed)
CSN: [id number]     Arrival date & time 10/14/14  1459 History   First MD Initiated Contact with Patient 10/14/14 1513     Chief Complaint  Patient presents with  . Chest Pain  . Dizziness     (Consider location/radiation/quality/duration/timing/severity/associated sxs/prior Treatment) HPI Comments: Patient with history of high blood pressure, high cholesterol, history of cervical radiculopathy in past (no surgical corrections) -- presents with complaint of right upper extremity paresthesias, weakness which began approximately 3 days ago. Patient had some intermittent symptoms prior to this but they have been constant since this time. She complains of a generalized feeling of disequilibrium as well as nausea. Patient had multiple episodes of vomiting during a trip to [facility] yesterday. During this time she has had intermittent right-sided chest pain described as "feeling like I have to wheeze". She also complains of trouble with memory and concentration that has been going on for months however much more prominently over the past several days. She has been having trouble holding and moving objects with her right hand and feels less coordinated. Patient states that she broke her blender after trying to move it with her right arm. She has had intermittent mild headache. No neck pain. No URI symptoms. No significant shortness of breath or abdominal pain. No urinary symptoms. No weakness in her legs. No urinary retention or fecal incontinence. Patient states that her grandmother began having strokes in her 7s.  The history is provided by the patient and medical records.    Past Medical History  Diagnosis Date  . Hyperlipidemia   . Hypertension   . Genital herpes   . FNHTR (febrile nonhemolytic transfusion reaction)   . Vitamin deficiency   . Arthritis   . Pneumonia     hx of   . Bronchitis     hx of  . Urinary (tract) obstruction     hx of  . Headache(784.0)     migraines  . Anemia      hx of   Past Surgical History  Procedure Laterality Date  . Cesarean section    . Abdominal hysterectomy    . Cholecystectomy    . Hammer toe surgery  2000    both feet  . Liver surgery    . Hyperplasia tissue excision    . Cardiovascular stress test      2000  . Doppler echocardiography      2000   No family history on file. History  Substance Use Topics  . Smoking status: Never Smoker   . Smokeless tobacco: Not on file  . Alcohol Use: Yes     Comment: wine / rare   OB History    No data available     Review of Systems  Constitutional: Negative for fever and diaphoresis.  HENT: Negative for congestion, dental problem, rhinorrhea and sinus pressure.   Eyes: Positive for visual disturbance (blurry vision). Negative for photophobia, discharge and redness.  Respiratory: Negative for cough and shortness of breath.   Cardiovascular: Positive for chest pain. Negative for palpitations and leg swelling.  Gastrointestinal: Positive for nausea and vomiting. Negative for abdominal pain.  Genitourinary: Negative for dysuria.  Musculoskeletal: Negative for back pain, gait problem, neck pain and neck stiffness.  Skin: Negative for rash.  Neurological: Positive for dizziness (dysequilibrium), weakness (R UE), numbness (pins and needles sensation) and headaches. Negative for tremors, seizures, syncope, facial asymmetry, speech difficulty and light-headedness.  Psychiatric/Behavioral: Positive for confusion and decreased concentration.  Allergies  Coconut fatty acids and Lisinopril  Home Medications   Prior to Admission medications   Medication Sig Start Date End Date Taking? Authorizing Provider  acyclovir (ZOVIRAX) 400 MG tablet Take 400 mg by mouth every 4 (four) hours while awake.    Historical Provider, MD  aspirin-acetaminophen-caffeine (EXCEDRIN MIGRAINE) [phone] MG per tablet Take 1 tablet by mouth every 6 (six) hours as needed. For headache    Historical Provider,  MD  Cholecalciferol (VITAMIN D3) 1000 UNITS CAPS Take 1,000 Units by mouth daily.     Historical Provider, MD  HYDROcodone-acetaminophen (NORCO/VICODIN) 5-325 MG per tablet Take 1 tablet by mouth every 6 (six) hours as needed. 08/14/12   Ralene Ok, MD  hyoscyamine (ANASPAZ) 0.125 MG TBDP Place 0.125 mg under the tongue every 6 (six) hours as needed. For stomach spasms    Historical Provider, MD  losartan-hydrochlorothiazide (HYZAAR) 100-12.5 MG per tablet Take 1 tablet by mouth daily.    Historical Provider, MD   BP 149/99 mmHg  Pulse 96  Temp(Src) 98 F (36.7 C) (Oral)  Resp 22  SpO2 99%   Physical Exam  Constitutional: She is oriented to person, place, and time. She appears well-developed and well-nourished.  HENT:  Head: Normocephalic and atraumatic.  Right Ear: Tympanic membrane, external ear and ear canal normal.  Left Ear: Tympanic membrane, external ear and ear canal normal.  Nose: Nose normal.  Mouth/Throat: Uvula is midline, oropharynx is clear and moist and mucous membranes are normal. Mucous membranes are not dry.  Eyes: Conjunctivae, EOM and lids are normal. Pupils are equal, round, and reactive to light. Right eye exhibits no nystagmus. Left eye exhibits no nystagmus.  Neck: Trachea normal and normal range of motion. Neck supple. Normal carotid pulses and no JVD present. No muscular tenderness present. Carotid bruit is not present. No tracheal deviation present.  No carotid bruits.   Cardiovascular: Normal rate, regular rhythm, S1 normal, S2 normal, normal heart sounds and intact distal pulses.  Exam reveals no decreased pulses.   No murmur heard. No irregular HR  Pulmonary/Chest: Effort normal and breath sounds normal. No respiratory distress. She has no wheezes. She exhibits no tenderness.  Abdominal: Soft. Normal aorta and bowel sounds are normal. There is no tenderness. There is no rebound and no guarding.  Musculoskeletal: Normal range of motion.       Cervical  back: She exhibits normal range of motion, no tenderness and no bony tenderness.  Neurological: She is alert and oriented to person, place, and time. She has normal reflexes. A sensory deficit is present. No cranial nerve deficit. She displays a negative Romberg sign. Coordination normal. GCS eye subscore is 4. GCS verbal subscore is 5. GCS motor subscore is 6.  Normal propioception. Patient with decreased grip on R and weakness with wrist and elbow flexion/extension. Pt states pins and needles sensation from R hand to elbow and up medial upper arm.   Skin: Skin is warm and dry. She is not diaphoretic. No cyanosis. No pallor.  Psychiatric: She has a normal mood and affect.  Nursing note and vitals reviewed.   ED Course  Procedures (including critical care time) Labs Review Labs Reviewed  CBC - Abnormal; Notable for the following:    RBC 5.29 (*)    All other components within normal limits  URINALYSIS, ROUTINE W REFLEX MICROSCOPIC - Abnormal; Notable for the following:    APPearance CLOUDY (*)    All other components within normal limits  PROTIME-INR  APTT  DIFFERENTIAL  COMPREHENSIVE METABOLIC PANEL  URINE RAPID DRUG SCREEN (HOSP PERFORMED)  Randolm Idol, ED    Imaging Review Dg Chest 2 View  10/14/2014   CLINICAL DATA:  Right-sided chest pain and dizziness.  EXAM: CHEST  2 VIEW  COMPARISON:  Chest radiograph 01/15/2011  FINDINGS: Multiple leads overlie the patient. Stable enlarged cardiac and mediastinal contours. Eventration right hemidiaphragm. No large consolidative pulmonary opacities. No pleural effusion or pneumothorax. Right upper quadrant surgical clips. Mid thoracic spine degenerative change.  IMPRESSION: No acute cardiopulmonary process.   Electronically Signed   By: Lovey Newcomer M.D.   On: 10/14/2014 17:26   Ct Head Wo Contrast  10/14/2014   CLINICAL DATA:  Right arm numbness and chest pain shows the right arm numbness and right chest pain for a few days. Dizziness  and confusion beginning today. Initial encounter.  EXAM: CT HEAD WITHOUT CONTRAST  TECHNIQUE: Contiguous axial images were obtained from the base of the skull through the vertex without intravenous contrast.  COMPARISON:  None.  FINDINGS: The brain appears normal without hemorrhage, infarct, mass lesion, mass effect, midline shift or abnormal extra-axial fluid collection no hydrocephalus or pneumocephalus. The calvarium is intact. Imaged paranasal sinuses and mastoid air cells are clear.  IMPRESSION: Negative head CT.   Electronically Signed   By: Inge Rise M.D.   On: 10/14/2014 17:15     EKG Interpretation   Date/Time:  Sunday October 14 2014 15:16:10 EST Ventricular Rate:  92 PR Interval:  139 QRS Duration: 87 QT Interval:  374 QTC Calculation: 463 R Axis:   74 Text Interpretation:  Sinus rhythm Anteroseptal infarct, old Abnormal T,  consider ischemia, diffuse leads Baseline wander in lead(s) I III aVL V2  No significant change since last tracing Confirmed by JACUBOWITZ  MD, SAM  [id number]) on 10/14/2014 3:18:24 PM      3:16 PM Pt not yet in room.   3:48 PM Patient seen and examined. Work-up initiated. Medications ordered.   Vital signs reviewed and are as follows: BP 149/99 mmHg  Pulse 96  Temp(Src) 98 F (36.7 C) (Oral)  Resp 22  SpO2 99%  4:25 PM Patient discussed with and seen by Dr. Winfred Leeds. Will complete work-up, will admit for chest pain observation.   6:14 PM Pt informed of results. She requests admission for CP. She appears well. No current CP.   6:23 PM Spoke with Dr. Arnoldo Morale who will see.   MDM   Final diagnoses:  Chest pain  Paresthesia of right arm   Admit for CP obs -- HEART=3, EKG unchanged, atypical CP.          Carlisle Cater, PA-C 10/14/14 1824  Orlie Dakin, MD 10/14/14 1859

## 2014-10-14 NOTE — ED Notes (Signed)
Delay in lab d/t need for ultrasound IV

## 2014-10-14 NOTE — ED Notes (Signed)
Pt request to wait for labs to be drawn with iv start

## 2014-10-14 NOTE — ED Notes (Signed)
[facility] picked up patient

## 2014-10-14 NOTE — ED Notes (Signed)
Patient complains of right arm numbness and right chest pain for a few days, states she woke up today feeling dizzy, mild confusion, and endorses some difficulty remembering things. Pt states she went to bed last night and woke with these symptoms. On assessment, right arm is weak. Patient given the word "bicycle" to remember. Will assess if patient remembers this word. Vonna Kotyk, [place], made aware.

## 2014-10-15 ENCOUNTER — Encounter (HOSPITAL_COMMUNITY): Payer: Self-pay | Admitting: *Deleted

## 2014-10-15 DIAGNOSIS — G459 Transient cerebral ischemic attack, unspecified: Secondary | ICD-10-CM

## 2014-10-15 DIAGNOSIS — I519 Heart disease, unspecified: Secondary | ICD-10-CM

## 2014-10-15 LAB — CBC
HCT: 41.4 % (ref 36.0–46.0)
Hemoglobin: 13.1 g/dL (ref 12.0–15.0)
MCH: 26.8 pg (ref 26.0–34.0)
MCHC: 31.6 g/dL (ref 30.0–36.0)
MCV: 84.7 fL (ref 78.0–100.0)
PLATELETS: 214 10*3/uL (ref 150–400)
RBC: 4.89 MIL/uL (ref 3.87–5.11)
RDW: 14.8 % (ref 11.5–15.5)
WBC: 7.9 10*3/uL (ref 4.0–10.5)

## 2014-10-15 LAB — BASIC METABOLIC PANEL
Anion gap: 14 (ref 5–15)
BUN: 11 mg/dL (ref 6–23)
CO2: 25 mEq/L (ref 19–32)
CREATININE: 0.67 mg/dL (ref 0.50–1.10)
Calcium: 9 mg/dL (ref 8.4–10.5)
Chloride: 104 mEq/L (ref 96–112)
Glucose, Bld: 145 mg/dL — ABNORMAL HIGH (ref 70–99)
Potassium: 3.5 mEq/L — ABNORMAL LOW (ref 3.7–5.3)
Sodium: 143 mEq/L (ref 137–147)

## 2014-10-15 LAB — LIPID PANEL
CHOLESTEROL: 198 mg/dL (ref 0–200)
HDL: 42 mg/dL (ref >39)
LDL Cholesterol: 138 mg/dL — ABNORMAL HIGH (ref 0–99)
TRIGLYCERIDES: 89 mg/dL (ref <150)
Total CHOL/HDL Ratio: 4.7 RATIO
VLDL: 18 mg/dL (ref 0–40)

## 2014-10-15 LAB — TROPONIN I
Troponin I: <0.3 ng/mL (ref <0.30)
Troponin I: <0.3 ng/mL (ref <0.30)

## 2014-10-15 LAB — HEMOGLOBIN A1C
HEMOGLOBIN A1C: 6.3 % — AB (ref <5.7)
MEAN PLASMA GLUCOSE: 134 mg/dL — AB (ref <117)

## 2014-10-15 MED ORDER — DIPHENHYDRAMINE HCL 50 MG/ML IJ SOLN
25.0000 mg | Freq: Once | INTRAMUSCULAR | Status: AC
  Administered 2014-10-15: 25 mg via INTRAVENOUS
  Filled 2014-10-15: qty 1

## 2014-10-15 MED ORDER — METOCLOPRAMIDE HCL 5 MG/ML IJ SOLN
10.0000 mg | Freq: Once | INTRAMUSCULAR | Status: AC
Start: 2014-10-15 — End: 2014-10-15
  Administered 2014-10-15: 10 mg via INTRAVENOUS
  Filled 2014-10-15: qty 2

## 2014-10-15 MED ORDER — KETOROLAC TROMETHAMINE 30 MG/ML IJ SOLN
30.0000 mg | Freq: Once | INTRAMUSCULAR | Status: AC
  Administered 2014-10-15: 30 mg via INTRAVENOUS
  Filled 2014-10-15: qty 1

## 2014-10-15 NOTE — Discharge Summary (Signed)
Renee Mills, 53 y.o., DOB 13-May-1961, MRN [id number]. Admission date: 10/14/2014 Discharge Date 10/15/2014 Primary MD Marjorie Smolder, MD Admitting Physician Theressa Millard, MD  Admission Diagnosis  TIA (transient ischemic attack) [G45.9] Paresthesia of right arm [R20.2] Chest pain [R07.9]  Discharge Diagnosis   Principal Problem:   Chest pain at rest Active Problems:   Arm weakness   Essential hypertension   Hyperlipidemia      Past Medical History  Diagnosis Date  . Hyperlipidemia   . Hypertension   . Genital herpes   . FNHTR (febrile nonhemolytic transfusion reaction)   . Vitamin deficiency   . Arthritis   . Pneumonia     hx of   . Bronchitis     hx of  . Urinary (tract) obstruction     hx of  . Headache(784.0)     migraines  . Anemia     hx of    Past Surgical History  Procedure Laterality Date  . Cesarean section    . Abdominal hysterectomy    . Cholecystectomy    . Hammer toe surgery  2000    both feet  . Liver surgery    . Hyperplasia tissue excision    . Cardiovascular stress test      2000  . Doppler echocardiography      2000  . Tumor removal      cecum gland     Admission history of present illness/brief narrative: 53 year old female with no prior cardiac history for evaluation of chest pain. And right arm numbness, Patient states she typically does not have dyspnea on exertion, orthopnea, PND, pedal edema, palpitations, syncope or exertional chest pain. Had cardiac catheterization in April 2001. 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 LV function was normal. Recently returned from a trip to [place]. Presented with complaints of dizziness, numbness in her right upper extremity and right sided chest pain.   Chest pain has been there continuously for 2 days. Not pleuritic, positional or related to food. Not exertional. Increased with anxiety. She has had some nausea and vomiting as well. Admitted  to telemetry, no significant events, had negative cardiac enzymes 3, EKG showing inverted T waves on anterior and inferior leads which is old, 2-D echo showing EF 55% with grade 1 diastolic dysfunction, patient was evaluated by cardiology with recommendation for outpatient functional study.  Regarding right arm tingling numbness, there was no weakness noticed on my physical exam, patient had MRI brain, MRA head, which did not show any acute findings or evidence of CVA, carotid Doppler does not show any significant stenosis, patient's symptoms is exacerbated by work, this is most likely related to cervical radiculopathy, she was encouraged to follow with neurology as outpatient.  Hospital Course See H&P, Labs, Consult and Test reports for all details in brief, patient was admitted for **  Principal Problem:   Chest pain at rest Active Problems:   Arm weakness   Essential hypertension   Hyperlipidemia    Consults  Cardiology  Significant Tests:  See full reports for all details    Dg Chest 2 View  10/14/2014   CLINICAL DATA:  Right-sided chest pain and dizziness.  EXAM: CHEST  2 VIEW  COMPARISON:  Chest radiograph 01/15/2011  FINDINGS: Multiple leads overlie the patient. Stable enlarged cardiac and mediastinal contours. Eventration right hemidiaphragm. No large consolidative pulmonary opacities. No pleural effusion or pneumothorax. Right upper quadrant surgical clips. Mid thoracic spine degenerative change.  IMPRESSION: No acute cardiopulmonary process.   Electronically Signed   By: Lovey Newcomer M.D.   On: 10/14/2014 17:26   Ct Head Wo Contrast  10/14/2014   CLINICAL DATA:  Right arm numbness and chest pain shows the right arm numbness and right chest pain for a few days. Dizziness and confusion beginning today. Initial encounter.  EXAM: CT HEAD WITHOUT CONTRAST  TECHNIQUE: Contiguous axial images were obtained from the base of the skull through the vertex without intravenous contrast.   COMPARISON:  None.  FINDINGS: The brain appears normal without hemorrhage, infarct, mass lesion, mass effect, midline shift or abnormal extra-axial fluid collection no hydrocephalus or pneumocephalus. The calvarium is intact. Imaged paranasal sinuses and mastoid air cells are clear.  IMPRESSION: Negative head CT.   Electronically Signed   By: Inge Rise M.D.   On: 10/14/2014 17:15   Mri Brain Without Contrast  10/15/2014   CLINICAL DATA:  Hypertension, hyperlipidemia, feeling poorly for 2 days, chest pain, shortness of breath, nausea, vomiting, near syncope with RIGHT arm intermittent paresthesias and weakness for 3 days.  EXAM: MRI HEAD WITHOUT CONTRAST  MRA HEAD WITHOUT CONTRAST  TECHNIQUE: Multiplanar, multiecho pulse sequences of the brain and surrounding structures were obtained without intravenous contrast. Angiographic images of the head were obtained using MRA technique without contrast.  COMPARISON:  CT of the head October 22, 2014  FINDINGS: MRI HEAD FINDINGS  The ventricles and sulci are normal for patient's age. A few scattered subcentimeter supratentorial white matter T2 hyperintensities, within normal range for patient's age, and may reflect chronic small vessel ischemic disease or perivascular spaces. No suspicious parenchymal signal, mass lesions, mass effect. No reduced diffusion to suggest acute ischemia. No susceptibility artifact to suggest hemorrhage. Bilateral inferior basal ganglia and thalamus perivascular spaces.  No abnormal extra-axial fluid collections. No extra-axial masses though, contrast enhanced sequences would be more sensitive. Normal major intracranial vascular flow voids seen at the skull base.  Ocular globes and orbital contents are unremarkable though not tailored for evaluation. No abnormal sellar expansion. Visualized paranasal sinuses and mastoid air cells are well-aerated. No suspicious calvarial bone marrow signal. No abnormal sellar expansion. Craniocervical  junction maintained.  MRA HEAD FINDINGS  Anterior circulation: Slight contour irregularity of the LEFT cervical internal carotid artery, axial 1 43/152. Normal flow related enhancement of the included petrous, cavernous and supra clinoid internal carotid arteries. Patent anterior communicating artery. Normal flow related enhancement of the anterior and middle cerebral arteries, including more distal segments.  No large vessel occlusion, hemodynamically significant stenosis, abnormal luminal irregularity, aneurysm.  Posterior circulation: vertebral artery is dominant. Basilar artery is patent, with normal flow related enhancement of the main branch vessels. Normal flow related enhancement of the posterior cerebral arteries. Tiny LEFT posterior communicating artery is present.  No large vessel occlusion, hemodynamically significant stenosis, abnormal luminal irregularity, aneurysm.  IMPRESSION: MRI HEAD: Normal noncontrast MRI of the head.  MRA HEAD: Slight contour irregularity of the proximal LEFT cervical internal carotid artery may represent artifact (or possible non hemodynamically significant stenosis), otherwise unremarkable MRA of the head.   Electronically Signed   By: Elon Alas   On: 10/15/2014 00:55   Mr Jodene Nam Head/brain Wo Cm  10/15/2014   CLINICAL DATA:  Hypertension, hyperlipidemia, feeling poorly for 2 days, chest pain, shortness of breath, nausea, vomiting, near syncope with RIGHT arm intermittent paresthesias and weakness for 3 days.  EXAM: MRI HEAD WITHOUT CONTRAST  MRA HEAD WITHOUT CONTRAST  TECHNIQUE: Multiplanar, multiecho pulse  sequences of the brain and surrounding structures were obtained without intravenous contrast. Angiographic images of the head were obtained using MRA technique without contrast.  COMPARISON:  CT of the head October 22, 2014  FINDINGS: MRI HEAD FINDINGS  The ventricles and sulci are normal for patient's age. A few scattered subcentimeter supratentorial white  matter T2 hyperintensities, within normal range for patient's age, and may reflect chronic small vessel ischemic disease or perivascular spaces. No suspicious parenchymal signal, mass lesions, mass effect. No reduced diffusion to suggest acute ischemia. No susceptibility artifact to suggest hemorrhage. Bilateral inferior basal ganglia and thalamus perivascular spaces.  No abnormal extra-axial fluid collections. No extra-axial masses though, contrast enhanced sequences would be more sensitive. Normal major intracranial vascular flow voids seen at the skull base.  Ocular globes and orbital contents are unremarkable though not tailored for evaluation. No abnormal sellar expansion. Visualized paranasal sinuses and mastoid air cells are well-aerated. No suspicious calvarial bone marrow signal. No abnormal sellar expansion. Craniocervical junction maintained.  MRA HEAD FINDINGS  Anterior circulation: Slight contour irregularity of the LEFT cervical internal carotid artery, axial 1 43/152. Normal flow related enhancement of the included petrous, cavernous and supra clinoid internal carotid arteries. Patent anterior communicating artery. Normal flow related enhancement of the anterior and middle cerebral arteries, including more distal segments.  No large vessel occlusion, hemodynamically significant stenosis, abnormal luminal irregularity, aneurysm.  Posterior circulation: vertebral artery is dominant. Basilar artery is patent, with normal flow related enhancement of the main branch vessels. Normal flow related enhancement of the posterior cerebral arteries. Tiny LEFT posterior communicating artery is present.  No large vessel occlusion, hemodynamically significant stenosis, abnormal luminal irregularity, aneurysm.  IMPRESSION: MRI HEAD: Normal noncontrast MRI of the head.  MRA HEAD: Slight contour irregularity of the proximal LEFT cervical internal carotid artery may represent artifact (or possible non hemodynamically  significant stenosis), otherwise unremarkable MRA of the head.   Electronically Signed   By: Elon Alas   On: 10/15/2014 00:55     Today   Subjective:   Renee Mills today has no headache,no chest abdominal pain,no new weakness tingling or numbness, feels better today.  Objective:   Blood pressure 109/69, pulse 92, temperature 98.5 F (36.9 C), temperature source Oral, resp. rate 16, height 5\' 3"  (1.6 m), weight 94.53 kg (208 lb 6.4 oz), SpO2 93 %.  Intake/Output Summary (Last 24 hours) at 10/15/14 1330 Last data filed at 10/15/14 0500  Gross per 24 hour  Intake    300 ml  Output      0 ml  Net    300 ml    Exam Awake Alert, Oriented *3, No new F.N deficits, Normal affect [place].AT,PERRAL Supple Neck,No JVD, No cervical lymphadenopathy appriciated.  Symmetrical Chest wall movement, Good air movement bilaterally, CTAB RRR,No Gallops,Rubs or new Murmurs, No Parasternal Heave +ve B.Sounds, Abd Soft, Non tender, No organomegaly appriciated, No rebound -guarding or rigidity. No Cyanosis, Clubbing or edema, No new Rash or bruise  Data Review     CBC w Diff: Lab Results  Component Value Date   WBC 7.9 10/15/2014   HGB 13.1 10/15/2014   HCT 41.4 10/15/2014   PLT 214 10/15/2014   LYMPHOPCT 29 10/14/2014   MONOPCT 5 10/14/2014   EOSPCT 1 10/14/2014   BASOPCT 1 10/14/2014   CMP: Lab Results  Component Value Date   NA 143 10/15/2014   K 3.5* 10/15/2014   CL 104 10/15/2014   CO2 25 10/15/2014   BUN 11  10/15/2014   CREATININE 0.67 10/15/2014   PROT 7.8 10/14/2014   ALBUMIN 4.1 10/14/2014   BILITOT 0.6 10/14/2014   ALKPHOS 60 10/14/2014   AST 25 10/14/2014   ALT 18 10/14/2014  .  Micro Results No results found for this or any previous visit (from the past 240 hour(s)).   Discharge Instructions      Follow-up Information    Follow up with Marjorie Smolder, MD In 1 week.   Specialty:  Family Medicine   Contact information:   [place]  [address] [address] [phone]       Follow up with Kirk Ruths, MD. Schedule an appointment as soon as possible for a visit in 3 weeks.   Specialty:  Cardiology   Contact information:   [address] [place] [address] [phone]       Discharge Medications     Medication List    STOP taking these medications        acyclovir 400 MG tablet  Commonly known as:  ZOVIRAX      TAKE these medications        aspirin-acetaminophen-caffeine 248-250-03 MG per tablet  Commonly known as:  EXCEDRIN MIGRAINE  Take 1 tablet by mouth every 6 (six) hours as needed for headache (headache). For headache     BENTYL PO  Take 1 tablet by mouth daily as needed (stomach pain).     CYMBALTA PO  Take 1 capsule by mouth daily as needed (depression).     HYDROcodone-acetaminophen 5-325 MG per tablet  Commonly known as:  NORCO/VICODIN  Take 1 tablet by mouth every 6 (six) hours as needed.     hyoscyamine 0.125 MG Tbdp disintergrating tablet  Commonly known as:  ANASPAZ  Place 0.125 mg under the tongue every 6 (six) hours as needed. For stomach spasms     losartan-hydrochlorothiazide 100-12.5 MG per tablet  Commonly known as:  HYZAAR  Take 1 tablet by mouth daily.     Vitamin D3 1000 UNITS Caps  Take 1,000 Units by mouth daily.         Total Time in preparing paper work, data evaluation and todays exam - 35 minutes  Caryle Helgeson M.D on 10/15/2014 at 1:30 PM  [place]  [phone]

## 2014-10-15 NOTE — Progress Notes (Signed)
  Echocardiogram 2D Echocardiogram has been performed.  Darlina Sicilian M 10/15/2014, 10:32 AM

## 2014-10-15 NOTE — Progress Notes (Signed)
Pt discharged home with daughter.  Reviewed discharge instructions and education, all questions answered.  Assessment unchanged from earlier.

## 2014-10-15 NOTE — Progress Notes (Signed)
UR completed 

## 2014-10-15 NOTE — Discharge Instructions (Signed)
Follow with Primary MD Marjorie Smolder, MD in 7 days   Get CBC, CMP, 2 view Chest X ray checked  by Primary MD next visit.  Please follow with an outpatient in urology regarding right arm tingling numbness as is most likely related to cervical radiculopathy. Please follow with cardiology as an outpatient regarding outpatient evaluation.   Activity: As tolerated with Full fall precautions use walker/cane & assistance as needed   Disposition Home    Diet: Heart Healthy  , with feeding assistance and aspiration precautions as needed.  For Heart failure patients - Check your Weight same time everyday, if you gain over 2 pounds, or you develop in leg swelling, experience more shortness of breath or chest pain, call your Primary MD immediately. Follow Cardiac Low Salt Diet and 1.8 lit/day fluid restriction.   On your next visit with your primary care physician please Get Medicines reviewed and adjusted.   Please request your Prim.MD to go over all Hospital Tests and Procedure/Radiological results at the follow up, please get all Hospital records sent to your Prim MD by signing hospital release before you go home.   If you experience worsening of your admission symptoms, develop shortness of breath, life threatening emergency, suicidal or homicidal thoughts you must seek medical attention immediately by calling 911 or calling your MD immediately  if symptoms less severe.  You Must read complete instructions/literature along with all the possible adverse reactions/side effects for all the Medicines you take and that have been prescribed to you. Take any new Medicines after you have completely understood and accpet all the possible adverse reactions/side effects.   Do not drive, operating heavy machinery, perform activities at heights, swimming or participation in water activities or provide baby sitting services if your were admitted for syncope or siezures until you have seen by Primary MD or a  Neurologist and advised to do so again.  Do not drive when taking Pain medications.    Do not take more than prescribed Pain, Sleep and Anxiety Medications  Special Instructions: If you have smoked or chewed Tobacco  in the last 2 yrs please stop smoking, stop any regular Alcohol  and or any Recreational drug use.  Wear Seat belts while driving.   Please note  You were cared for by a hospitalist during your hospital stay. If you have any questions about your discharge medications or the care you received while you were in the hospital after you are discharged, you can call the unit and asked to speak with the hospitalist on call if the hospitalist that took care of you is not available. Once you are discharged, your primary care physician will handle any further medical issues. Please note that NO REFILLS for any discharge medications will be authorized once you are discharged, as it is imperative that you return to your primary care physician (or establish a relationship with a primary care physician if you do not have one) for your aftercare needs so that they can reassess your need for medications and monitor your lab values.

## 2014-10-15 NOTE — Consult Note (Signed)
Primary cardiologist: New   HPI: 53 year old female with no prior cardiac history for evaluation of chest pain. Patient states she typically does not have dyspnea on exertion, orthopnea, PND, pedal edema, palpitations, syncope or exertional chest pain. Had cardiac catheterization in April 2001. 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 LV function was normal. Recently returned from a trip to [place]. Presented with complaints of dizziness, numbness in her right upper extremity and right sided chest pain. The pain in her chest has been there continuously for 2 days. Not pleuritic, positional or related to food. Not exertional. Increased with anxiety. She has had some nausea and vomiting as well. Admitted for further evaluation and cardiology asked to evaluate.  Medications Prior to Admission  Medication Sig Dispense Refill  . acyclovir (ZOVIRAX) 400 MG tablet Take 400 mg by mouth every 4 (four) hours as needed (genital warts).     Marland Kitchen aspirin-acetaminophen-caffeine (EXCEDRIN MIGRAINE) 250-250-65 MG per tablet Take 1 tablet by mouth every 6 (six) hours as needed for headache (headache). For headache    . Cholecalciferol (VITAMIN D3) 1000 UNITS CAPS Take 1,000 Units by mouth daily.     . Dicyclomine HCl (BENTYL PO) Take 1 tablet by mouth daily as needed (stomach pain).    . DULoxetine HCl (CYMBALTA PO) Take 1 capsule by mouth daily as needed (depression).    Marland Kitchen losartan-hydrochlorothiazide (HYZAAR) 100-12.5 MG per tablet Take 1 tablet by mouth daily.    Marland Kitchen HYDROcodone-acetaminophen (NORCO/VICODIN) 5-325 MG per tablet Take 1 tablet by mouth every 6 (six) hours as needed. 30 tablet 0  . hyoscyamine (ANASPAZ) 0.125 MG TBDP Place 0.125 mg under the tongue every 6 (six) hours as needed. For stomach spasms      Allergies  Allergen Reactions  . Coconut Fatty Acids Nausea Only  . Dilaudid [Hydromorphone Hcl]     Headache, hallucinate  . Lisinopril    cough     Past Medical History  Diagnosis Date  . Hyperlipidemia   . Hypertension   . Genital herpes   . FNHTR (febrile nonhemolytic transfusion reaction)   . Vitamin deficiency   . Arthritis   . Pneumonia     hx of   . Bronchitis     hx of  . Urinary (tract) obstruction     hx of  . Headache(784.0)     migraines  . Anemia     hx of    Past Surgical History  Procedure Laterality Date  . Cesarean section    . Abdominal hysterectomy    . Cholecystectomy    . Hammer toe surgery  2000    both feet  . Liver surgery    . Hyperplasia tissue excision    . Cardiovascular stress test      2000  . Doppler echocardiography      2000  . Tumor removal      cecum gland     History   Social History  . Marital Status: Married    Spouse Name: N/A    Number of Children: N/A  . Years of Education: N/A   Occupational History  . Not on file.   Social History Main Topics  . Smoking status: Former Smoker    Types: Cigarettes  . Smokeless tobacco: Never Used     Comment: as a teenager smoked "every now and again"  . Alcohol Use: Yes     Comment: wine / rare  .  Drug Use: No  . Sexual Activity: Not on file   Other Topics Concern  . Not on file   Social History Narrative    Family History  Problem Relation Age of Onset  . Breast cancer Mother   . Arthritis Mother   . Hypertension Father   . Dementia Father   . Heart disease Father   . Arthritis Sister     ROS:  Numbness in right upper extremity, nausea and vomiting but no fevers or chills, productive cough, hemoptysis, dysphasia, odynophagia, melena, hematochezia, dysuria, hematuria, rash, seizure activity, orthopnea, PND, pedal edema, claudication. Remaining systems are negative.  Physical Exam:   Blood pressure 125/84, pulse 94, temperature 98.5 F (36.9 C), temperature source Oral, resp. rate 19, height 5' 3"  (1.6 m), weight 208 lb 6.4 oz (94.53 kg), SpO2 98 %.  General:  Well developed/well nourished in  NAD Skin warm/dry Patient not depressed No peripheral clubbing Back-normal HEENT-normal/normal eyelids Neck supple/normal carotid upstroke bilaterally; no bruits; no JVD; no thyromegaly chest - CTA/ normal expansion CV - RRR/normal S1 and S2; no murmurs, rubs or gallops;  PMI nondisplaced Abdomen -NT/ND, no HSM, no mass, + bowel sounds, no bruit 2+ femoral pulses, no bruits Ext-no edema, chords, 2+ DP Neuro-grossly nonfocal  ECG sinus rhythm with inferior and anterior T-wave inversion.  Results for orders placed or performed during the hospital encounter of 10/14/14 (from the past 48 hour(s))  Urine Drug Screen     Status: None   Collection Time: 10/14/14  3:47 PM  Result Value Ref Range   Opiates NONE DETECTED NONE DETECTED   Cocaine NONE DETECTED NONE DETECTED   Benzodiazepines NONE DETECTED NONE DETECTED   Amphetamines NONE DETECTED NONE DETECTED   Tetrahydrocannabinol NONE DETECTED NONE DETECTED   Barbiturates NONE DETECTED NONE DETECTED    Comment:        DRUG SCREEN FOR MEDICAL PURPOSES ONLY.  IF CONFIRMATION IS NEEDED FOR ANY PURPOSE, NOTIFY LAB WITHIN 5 DAYS.        LOWEST DETECTABLE LIMITS FOR URINE DRUG SCREEN Drug Class       Cutoff (ng/mL) Amphetamine      1000 Barbiturate      200 Benzodiazepine   893 Tricyclics       734 Opiates          300 Cocaine          300 THC              50   Urinalysis, Routine w reflex microscopic     Status: Abnormal   Collection Time: 10/14/14  3:47 PM  Result Value Ref Range   Color, Urine YELLOW YELLOW   APPearance CLOUDY (A) CLEAR   Specific Gravity, Urine 1.024 1.005 - 1.030   pH 5.0 5.0 - 8.0   Glucose, UA NEGATIVE NEGATIVE mg/dL   Hgb urine dipstick NEGATIVE NEGATIVE   Bilirubin Urine NEGATIVE NEGATIVE   Ketones, ur NEGATIVE NEGATIVE mg/dL   Protein, ur NEGATIVE NEGATIVE mg/dL   Urobilinogen, UA 0.2 0.0 - 1.0 mg/dL   Nitrite NEGATIVE NEGATIVE   Leukocytes, UA NEGATIVE NEGATIVE    Comment: MICROSCOPIC NOT DONE  ON URINES WITH NEGATIVE PROTEIN, BLOOD, LEUKOCYTES, NITRITE, OR GLUCOSE <1000 mg/dL.  Protime-INR     Status: None   Collection Time: 10/14/14  4:57 PM  Result Value Ref Range   Prothrombin Time 12.9 11.6 - 15.2 seconds   INR 0.96 0.00 - 1.49  APTT     Status: None  Collection Time: 10/14/14  4:57 PM  Result Value Ref Range   aPTT 30 24 - 37 seconds  CBC     Status: Abnormal   Collection Time: 10/14/14  4:57 PM  Result Value Ref Range   WBC 9.7 4.0 - 10.5 K/uL   RBC 5.29 (H) 3.87 - 5.11 MIL/uL   Hemoglobin 14.4 12.0 - 15.0 g/dL   HCT 45.1 36.0 - 46.0 %   MCV 85.3 78.0 - 100.0 fL   MCH 27.2 26.0 - 34.0 pg   MCHC 31.9 30.0 - 36.0 g/dL   RDW 14.7 11.5 - 15.5 %   Platelets 231 150 - 400 K/uL  Differential     Status: None   Collection Time: 10/14/14  4:57 PM  Result Value Ref Range   Neutrophils Relative % 64 43 - 77 %   Neutro Abs 6.3 1.7 - 7.7 K/uL   Lymphocytes Relative 29 12 - 46 %   Lymphs Abs 2.8 0.7 - 4.0 K/uL   Monocytes Relative 5 3 - 12 %   Monocytes Absolute 0.5 0.1 - 1.0 K/uL   Eosinophils Relative 1 0 - 5 %   Eosinophils Absolute 0.1 0.0 - 0.7 K/uL   Basophils Relative 1 0 - 1 %   Basophils Absolute 0.1 0.0 - 0.1 K/uL  Comprehensive metabolic panel     Status: None   Collection Time: 10/14/14  4:57 PM  Result Value Ref Range   Sodium 140 137 - 147 mEq/L   Potassium 4.4 3.7 - 5.3 mEq/L   Chloride 102 96 - 112 mEq/L   CO2 25 19 - 32 mEq/L   Glucose, Bld 95 70 - 99 mg/dL   BUN 11 6 - 23 mg/dL   Creatinine, Ser 0.66 0.50 - 1.10 mg/dL   Calcium 9.5 8.4 - 10.5 mg/dL   Total Protein 7.8 6.0 - 8.3 g/dL   Albumin 4.1 3.5 - 5.2 g/dL   AST 25 0 - 37 U/L    Comment: SLIGHT HEMOLYSIS HEMOLYSIS AT THIS LEVEL MAY AFFECT RESULT    ALT 18 0 - 35 U/L   Alkaline Phosphatase 60 39 - 117 U/L   Total Bilirubin 0.6 0.3 - 1.2 mg/dL   GFR calc non Af Amer >90 >90 mL/min   GFR calc Af Amer >90 >90 mL/min    Comment: (NOTE) The eGFR has been calculated using the CKD EPI  equation. This calculation has not been validated in all clinical situations. eGFR's persistently <90 mL/min signify possible Chronic Kidney Disease.    Anion gap 13 5 - 15  I-stat troponin, ED (not at [facility])     Status: None   Collection Time: 10/14/14  5:23 PM  Result Value Ref Range   Troponin i, poc 0.01 0.00 - 0.08 ng/mL   Comment 3            Comment: Due to the release kinetics of cTnI, a negative result within the first hours of the onset of symptoms does not rule out myocardial infarction with certainty. If myocardial infarction is still suspected, repeat the test at appropriate intervals.   Troponin I (q 6hr x 3)     Status: None   Collection Time: 10/14/14  7:37 PM  Result Value Ref Range   Troponin I <0.30 <0.30 ng/mL    Comment:        Due to the release kinetics of cTnI, a negative result within the first hours of the onset of symptoms does not rule  out myocardial infarction with certainty. If myocardial infarction is still suspected, repeat the test at appropriate intervals.   Basic metabolic panel     Status: Abnormal   Collection Time: 10/15/14  3:55 AM  Result Value Ref Range   Sodium 143 137 - 147 mEq/L   Potassium 3.5 (L) 3.7 - 5.3 mEq/L   Chloride 104 96 - 112 mEq/L   CO2 25 19 - 32 mEq/L   Glucose, Bld 145 (H) 70 - 99 mg/dL   BUN 11 6 - 23 mg/dL   Creatinine, Ser 0.67 0.50 - 1.10 mg/dL   Calcium 9.0 8.4 - 10.5 mg/dL   GFR calc non Af Amer >90 >90 mL/min   GFR calc Af Amer >90 >90 mL/min    Comment: (NOTE) The eGFR has been calculated using the CKD EPI equation. This calculation has not been validated in all clinical situations. eGFR's persistently <90 mL/min signify possible Chronic Kidney Disease.    Anion gap 14 5 - 15  CBC     Status: None   Collection Time: 10/15/14  3:55 AM  Result Value Ref Range   WBC 7.9 4.0 - 10.5 K/uL   RBC 4.89 3.87 - 5.11 MIL/uL   Hemoglobin 13.1 12.0 - 15.0 g/dL   HCT 41.4 36.0 - 46.0 %   MCV 84.7 78.0 - 100.0  fL   MCH 26.8 26.0 - 34.0 pg   MCHC 31.6 30.0 - 36.0 g/dL   RDW 14.8 11.5 - 15.5 %   Platelets 214 150 - 400 K/uL  Troponin I (q 6hr x 3)     Status: None   Collection Time: 10/15/14  3:55 AM  Result Value Ref Range   Troponin I <0.30 <0.30 ng/mL    Comment:        Due to the release kinetics of cTnI, a negative result within the first hours of the onset of symptoms does not rule out myocardial infarction with certainty. If myocardial infarction is still suspected, repeat the test at appropriate intervals.   Lipid panel     Status: Abnormal   Collection Time: 10/15/14  3:55 AM  Result Value Ref Range   Cholesterol 198 0 - 200 mg/dL   Triglycerides 89 <150 mg/dL   HDL 42 >39 mg/dL   Total CHOL/HDL Ratio 4.7 RATIO   VLDL 18 0 - 40 mg/dL   LDL Cholesterol 138 (H) 0 - 99 mg/dL    Comment:        Total Cholesterol/HDL:CHD Risk Coronary Heart Disease Risk Table                     Men   Women  1/2 Average Risk   3.4   3.3  Average Risk       5.0   4.4  2 X Average Risk   9.6   7.1  3 X Average Risk  23.4   11.0        Use the calculated Patient Ratio above and the CHD Risk Table to determine the patient's CHD Risk.        ATP III CLASSIFICATION (LDL):  <100     mg/dL   Optimal  100-129  mg/dL   Near or Above                    Optimal  130-159  mg/dL   Borderline  160-189  mg/dL   High  >190     mg/dL  Very High     Dg Chest 2 View  10/14/2014   CLINICAL DATA:  Right-sided chest pain and dizziness.  EXAM: CHEST  2 VIEW  COMPARISON:  Chest radiograph 01/15/2011  FINDINGS: Multiple leads overlie the patient. Stable enlarged cardiac and mediastinal contours. Eventration right hemidiaphragm. No large consolidative pulmonary opacities. No pleural effusion or pneumothorax. Right upper quadrant surgical clips. Mid thoracic spine degenerative change.  IMPRESSION: No acute cardiopulmonary process.   Electronically Signed   By: Lovey Newcomer M.D.   On: 10/14/2014 17:26   Ct Head  Wo Contrast  10/14/2014   CLINICAL DATA:  Right arm numbness and chest pain shows the right arm numbness and right chest pain for a few days. Dizziness and confusion beginning today. Initial encounter.  EXAM: CT HEAD WITHOUT CONTRAST  TECHNIQUE: Contiguous axial images were obtained from the base of the skull through the vertex without intravenous contrast.  COMPARISON:  None.  FINDINGS: The brain appears normal without hemorrhage, infarct, mass lesion, mass effect, midline shift or abnormal extra-axial fluid collection no hydrocephalus or pneumocephalus. The calvarium is intact. Imaged paranasal sinuses and mastoid air cells are clear.  IMPRESSION: Negative head CT.   Electronically Signed   By: Inge Rise M.D.   On: 10/14/2014 17:15   Mri Brain Without Contrast  10/15/2014   CLINICAL DATA:  Hypertension, hyperlipidemia, feeling poorly for 2 days, chest pain, shortness of breath, nausea, vomiting, near syncope with RIGHT arm intermittent paresthesias and weakness for 3 days.  EXAM: MRI HEAD WITHOUT CONTRAST  MRA HEAD WITHOUT CONTRAST  TECHNIQUE: Multiplanar, multiecho pulse sequences of the brain and surrounding structures were obtained without intravenous contrast. Angiographic images of the head were obtained using MRA technique without contrast.  COMPARISON:  CT of the head October 22, 2014  FINDINGS: MRI HEAD FINDINGS  The ventricles and sulci are normal for patient's age. A few scattered subcentimeter supratentorial white matter T2 hyperintensities, within normal range for patient's age, and may reflect chronic small vessel ischemic disease or perivascular spaces. No suspicious parenchymal signal, mass lesions, mass effect. No reduced diffusion to suggest acute ischemia. No susceptibility artifact to suggest hemorrhage. Bilateral inferior basal ganglia and thalamus perivascular spaces.  No abnormal extra-axial fluid collections. No extra-axial masses though, contrast enhanced sequences would be  more sensitive. Normal major intracranial vascular flow voids seen at the skull base.  Ocular globes and orbital contents are unremarkable though not tailored for evaluation. No abnormal sellar expansion. Visualized paranasal sinuses and mastoid air cells are well-aerated. No suspicious calvarial bone marrow signal. No abnormal sellar expansion. Craniocervical junction maintained.  MRA HEAD FINDINGS  Anterior circulation: Slight contour irregularity of the LEFT cervical internal carotid artery, axial 1 43/152. Normal flow related enhancement of the included petrous, cavernous and supra clinoid internal carotid arteries. Patent anterior communicating artery. Normal flow related enhancement of the anterior and middle cerebral arteries, including more distal segments.  No large vessel occlusion, hemodynamically significant stenosis, abnormal luminal irregularity, aneurysm.  Posterior circulation: vertebral artery is dominant. Basilar artery is patent, with normal flow related enhancement of the main branch vessels. Normal flow related enhancement of the posterior cerebral arteries. Tiny LEFT posterior communicating artery is present.  No large vessel occlusion, hemodynamically significant stenosis, abnormal luminal irregularity, aneurysm.  IMPRESSION: MRI HEAD: Normal noncontrast MRI of the head.  MRA HEAD: Slight contour irregularity of the proximal LEFT cervical internal carotid artery may represent artifact (or possible non hemodynamically significant stenosis), otherwise unremarkable MRA of the head.  Electronically Signed   By: Elon Alas   On: 10/15/2014 00:55   Mr Jodene Nam Head/brain Wo Cm  10/15/2014   CLINICAL DATA:  Hypertension, hyperlipidemia, feeling poorly for 2 days, chest pain, shortness of breath, nausea, vomiting, near syncope with RIGHT arm intermittent paresthesias and weakness for 3 days.  EXAM: MRI HEAD WITHOUT CONTRAST  MRA HEAD WITHOUT CONTRAST  TECHNIQUE: Multiplanar, multiecho pulse  sequences of the brain and surrounding structures were obtained without intravenous contrast. Angiographic images of the head were obtained using MRA technique without contrast.  COMPARISON:  CT of the head October 22, 2014  FINDINGS: MRI HEAD FINDINGS  The ventricles and sulci are normal for patient's age. A few scattered subcentimeter supratentorial white matter T2 hyperintensities, within normal range for patient's age, and may reflect chronic small vessel ischemic disease or perivascular spaces. No suspicious parenchymal signal, mass lesions, mass effect. No reduced diffusion to suggest acute ischemia. No susceptibility artifact to suggest hemorrhage. Bilateral inferior basal ganglia and thalamus perivascular spaces.  No abnormal extra-axial fluid collections. No extra-axial masses though, contrast enhanced sequences would be more sensitive. Normal major intracranial vascular flow voids seen at the skull base.  Ocular globes and orbital contents are unremarkable though not tailored for evaluation. No abnormal sellar expansion. Visualized paranasal sinuses and mastoid air cells are well-aerated. No suspicious calvarial bone marrow signal. No abnormal sellar expansion. Craniocervical junction maintained.  MRA HEAD FINDINGS  Anterior circulation: Slight contour irregularity of the LEFT cervical internal carotid artery, axial 1 43/152. Normal flow related enhancement of the included petrous, cavernous and supra clinoid internal carotid arteries. Patent anterior communicating artery. Normal flow related enhancement of the anterior and middle cerebral arteries, including more distal segments.  No large vessel occlusion, hemodynamically significant stenosis, abnormal luminal irregularity, aneurysm.  Posterior circulation: vertebral artery is dominant. Basilar artery is patent, with normal flow related enhancement of the main branch vessels. Normal flow related enhancement of the posterior cerebral arteries. Tiny LEFT  posterior communicating artery is present.  No large vessel occlusion, hemodynamically significant stenosis, abnormal luminal irregularity, aneurysm.  IMPRESSION: MRI HEAD: Normal noncontrast MRI of the head.  MRA HEAD: Slight contour irregularity of the proximal LEFT cervical internal carotid artery may represent artifact (or possible non hemodynamically significant stenosis), otherwise unremarkable MRA of the head.   Electronically Signed   By: Elon Alas   On: 10/15/2014 00:55    Assessment/Plan 1 chest pain-patient's symptoms are atypical. She has ruled out for myocardial infarction with serial enzymes. There is note of T-wave inversion in the anterior and inferior leads. However this was present on previous electrocardiogram in 2013. Would arrange outpatient functional study following discharge and if negative follow-up with her primary care. Will discontinue nitroglycerin paste as this is causing an extreme headache. Echocardiogram is pending.  2 right upper extremity numbness-further evaluation per primary care. 3 hypertension-blood pressure is controlled. Continue present medications. Kirk Ruths MD 10/15/2014, 8:05 AM

## 2014-10-15 NOTE — Progress Notes (Signed)
VASCULAR LAB PRELIMINARY  PRELIMINARY  PRELIMINARY  PRELIMINARY  Carotid duplex  completed.    Preliminary report:  Bilateral:  1-39% ICA stenosis.  Vertebral artery flow is antegrade.      Renee Mills, RVT 10/15/2014, 11:17 AM

## 2014-11-05 ENCOUNTER — Ambulatory Visit (INDEPENDENT_AMBULATORY_CARE_PROVIDER_SITE_OTHER): Payer: Self-pay | Admitting: Neurology

## 2014-11-05 ENCOUNTER — Encounter: Payer: Self-pay | Admitting: Neurology

## 2014-11-05 ENCOUNTER — Ambulatory Visit (INDEPENDENT_AMBULATORY_CARE_PROVIDER_SITE_OTHER): Payer: 59 | Admitting: Neurology

## 2014-11-05 DIAGNOSIS — R202 Paresthesia of skin: Secondary | ICD-10-CM

## 2014-11-05 DIAGNOSIS — G5601 Carpal tunnel syndrome, right upper limb: Secondary | ICD-10-CM

## 2014-11-05 DIAGNOSIS — G5600 Carpal tunnel syndrome, unspecified upper limb: Secondary | ICD-10-CM | POA: Insufficient documentation

## 2014-11-05 NOTE — Procedures (Signed)
     HISTORY:  Renee Mills is a 54 year old patient with a two-month history of numbness affecting the right hand, with some discomfort into the elbow and shoulder as well. The patient is being evaluated for possible neuropathy or a cervical radiculopathy. She reports a prior history of cervical spine pain associated with a cervical disc.  NERVE CONDUCTION STUDIES:  Nerve conduction studies were performed on the right upper extremity. The distal motor latency of the right median nerve was prolonged, with a normal motor amplitude. The distal motor latency and motor amplitude for the right ulnar nerve is normal. The F wave latencies and nerve conduction velocities for the right median and ulnar nerves were normal. The sensory latency for the right median nerve is prolonged, normal for the right ulnar nerve.  The patient refused nerve conduction study evaluation of the left upper extremity.  EMG STUDIES:  EMG study was performed on the right upper extremity:  The first dorsal interosseous muscle reveals 2 to 4 K units with full recruitment. No fibrillations or positive waves were noted. The abductor pollicis brevis muscle reveals 2 to 4 K units with full recruitment. No fibrillations or positive waves were noted. The extensor indicis proprius muscle reveals 1 to 3 K units with full recruitment. No fibrillations or positive waves were noted. The pronator teres muscle reveals 2 to 3 K units with full recruitment. No fibrillations or positive waves were noted. The biceps muscle reveals 1 to 2 K units with full recruitment. No fibrillations or positive waves were noted. The triceps muscle reveals 2 to 4 K units with full recruitment. No fibrillations or positive waves were noted. The anterior deltoid muscle reveals 2 to 3 K units with full recruitment. No fibrillations or positive waves were noted. The cervical paraspinal muscles were tested at 2 levels. No abnormalities of insertional activity  were seen at either level tested. There was good relaxation.   IMPRESSION:  Nerve conduction studies of the right upper extremity revealed evidence of a mild right carpal tunnel syndrome. EMG evaluation of the right upper extremity was unremarkable, without evidence of an overlying cervical radiculopathy.  Jill Alexanders MD 11/05/2014 4:29 PM  [facility] [address] [place], [place] [address]  Phone [phone] Fax [phone]

## 2014-11-05 NOTE — Progress Notes (Signed)
Please refer to EMG and nerve conduction study procedure note. 

## 2014-11-28 NOTE — Progress Notes (Signed)
HPI: FU CP. Had cardiac catheterization in April 2001. 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 LV function normal. Admitted in December 2015 with atypical chest pain. Enzymes negative. Hemoglobin normal. Liver functions normal. Echocardiogram 10/15/2014 showed normal LV function, grade 1 diastolic dysfunction. Carotid Dopplers December 2015 showed 1-39% bilateral stenosis. Nerve conduction studies January 2016 showed evidence of mild right carpal tunnel syndrome. No cervical radiculopathy. Outpatient functional study recommended. Since discharge, she has some dyspnea on exertion but no orthopnea, PND or pedal edema. No further chest pain.  Current Outpatient Prescriptions  Medication Sig Dispense Refill  . aspirin-acetaminophen-caffeine (EXCEDRIN MIGRAINE) 250-250-65 MG per tablet Take 1 tablet by mouth every 6 (six) hours as needed for headache (headache). For headache    . Cholecalciferol (VITAMIN D3) 1000 UNITS CAPS Take 1,000 Units by mouth daily.     Marland Kitchen losartan-hydrochlorothiazide (HYZAAR) 100-12.5 MG per tablet Take 1 tablet by mouth daily.     No current facility-administered medications for this visit.    Allergies  Allergen Reactions  . Augmentin [Amoxicillin-Pot Clavulanate]   . Coconut Fatty Acids Nausea Only  . Dilaudid [Hydromorphone Hcl]     Headache, hallucinate  . Lisinopril     cough     Past Medical History  Diagnosis Date  . Hyperlipidemia   . Hypertension   . Genital herpes   . FNHTR (febrile nonhemolytic transfusion reaction)   . Vitamin deficiency   . Arthritis   . Pneumonia     hx of   . Bronchitis     hx of  . Urinary (tract) obstruction     hx of  . Headache(784.0)     migraines  . Anemia     hx of  . Carpal tunnel syndrome of right wrist     Past Surgical History  Procedure Laterality Date  . Cesarean section    . Abdominal hysterectomy    . Cholecystectomy    . Hammer toe surgery  2000    both feet  .  Liver surgery    . Hyperplasia tissue excision    . Cardiovascular stress test      2000  . Doppler echocardiography      2000  . Tumor removal      cecum gland     History   Social History  . Marital Status: Married    Spouse Name: N/A    Number of Children: N/A  . Years of Education: N/A   Occupational History  . Not on file.   Social History Main Topics  . Smoking status: Former Smoker    Types: Cigarettes  . Smokeless tobacco: Never Used     Comment: as a teenager smoked "every now and again"  . Alcohol Use: Yes     Comment: wine / rare  . Drug Use: No  . Sexual Activity: Not on file   Other Topics Concern  . Not on file   Social History Narrative    Family History  Problem Relation Age of Onset  . Breast cancer Mother   . Arthritis Mother   . Hypertension Father   . Dementia Father   . Heart disease Father   . Arthritis Sister     ROS: no fevers or chills, productive cough, hemoptysis, dysphasia, odynophagia, melena, hematochezia, dysuria, hematuria, rash, seizure activity, orthopnea, PND, pedal edema, claudication. Remaining systems are negative.  Physical Exam:   Blood pressure 122/82, pulse 64, height 5' 2.5" (1.588 m),  weight 210 lb 9.6 oz (95.528 kg).  General:  Well developed/obese in NAD Skin warm/dry Patient not depressed No peripheral clubbing Back-normal HEENT-normal/normal eyelids Neck supple/normal carotid upstroke bilaterally; no bruits; no JVD; no thyromegaly chest - CTA/ normal expansion CV - RRR/normal S1 and S2; no murmurs, rubs or gallops;  PMI nondisplaced Abdomen -NT/ND, no HSM, no mass, + bowel sounds, no bruit 2+ femoral pulses, no bruits Ext-no edema, chords, 2+ DP Neuro-grossly nonfocal  ECG 10/14/2014-sinus rhythm with inferior lateral T-wave version. No significant change compared to 08/02/2012.

## 2014-11-29 ENCOUNTER — Encounter: Payer: Self-pay | Admitting: *Deleted

## 2014-11-29 ENCOUNTER — Encounter: Payer: Self-pay | Admitting: Cardiology

## 2014-11-29 ENCOUNTER — Ambulatory Visit (INDEPENDENT_AMBULATORY_CARE_PROVIDER_SITE_OTHER): Payer: 59 | Admitting: Cardiology

## 2014-11-29 VITALS — BP 122/82 | HR 64 | Ht 62.5 in | Wt 210.6 lb

## 2014-11-29 DIAGNOSIS — E785 Hyperlipidemia, unspecified: Secondary | ICD-10-CM

## 2014-11-29 DIAGNOSIS — R079 Chest pain, unspecified: Secondary | ICD-10-CM

## 2014-11-29 NOTE — Assessment & Plan Note (Signed)
We'll treat with diet. Follow-up primary care.

## 2014-11-29 NOTE — Assessment & Plan Note (Signed)
Blood pressure controlled. Continue present medications. 

## 2014-11-29 NOTE — Assessment & Plan Note (Signed)
Patient has had no further chest pain.we'll plan a stress nuclear study for risk stratification.

## 2014-11-29 NOTE — Patient Instructions (Signed)
Your physician recommends that you schedule a follow-up appointment in: AS NEEDED PENDING TEST RESULTS  Your physician has requested that you have en exercise stress myoview. For further information please visit [email or web address]. Please follow instruction sheet, as given.

## 2014-12-11 ENCOUNTER — Telehealth (HOSPITAL_COMMUNITY): Payer: Self-pay

## 2014-12-11 NOTE — Telephone Encounter (Signed)
Encounter complete. 

## 2014-12-13 ENCOUNTER — Telehealth (HOSPITAL_COMMUNITY): Payer: Self-pay | Admitting: *Deleted

## 2014-12-13 ENCOUNTER — Encounter (HOSPITAL_COMMUNITY): Payer: 59

## 2014-12-19 ENCOUNTER — Encounter (HOSPITAL_COMMUNITY): Payer: 59

## 2015-12-25 ENCOUNTER — Ambulatory Visit
Admission: RE | Admit: 2015-12-25 | Discharge: 2015-12-25 | Disposition: A | Discharge status: Home / Self Care | Payer: 59 | Source: Ambulatory Visit | Attending: Family Medicine | Admitting: Family Medicine

## 2015-12-25 ENCOUNTER — Other Ambulatory Visit: Payer: Self-pay | Admitting: Family Medicine

## 2015-12-25 DIAGNOSIS — M25561 Pain in right knee: Secondary | ICD-10-CM

## 2019-11-27 DIAGNOSIS — R1310 Dysphagia, unspecified: Secondary | ICD-10-CM | POA: Diagnosis not present

## 2020-01-10 ENCOUNTER — Other Ambulatory Visit (HOSPITAL_COMMUNITY): Payer: Self-pay

## 2020-01-10 DIAGNOSIS — R1310 Dysphagia, unspecified: Secondary | ICD-10-CM

## 2020-01-17 ENCOUNTER — Encounter (HOSPITAL_COMMUNITY): Payer: 59

## 2020-01-17 ENCOUNTER — Ambulatory Visit (HOSPITAL_COMMUNITY): Payer: Self-pay

## 2020-01-17 ENCOUNTER — Encounter (HOSPITAL_COMMUNITY): Payer: Self-pay

## 2020-01-17 DIAGNOSIS — F321 Major depressive disorder, single episode, moderate: Secondary | ICD-10-CM | POA: Diagnosis not present

## 2020-02-02 DIAGNOSIS — I10 Essential (primary) hypertension: Secondary | ICD-10-CM | POA: Diagnosis not present

## 2020-02-02 DIAGNOSIS — Z0000 Encounter for general adult medical examination without abnormal findings: Secondary | ICD-10-CM | POA: Diagnosis not present

## 2020-02-02 DIAGNOSIS — R7309 Other abnormal glucose: Secondary | ICD-10-CM | POA: Diagnosis not present

## 2020-02-02 DIAGNOSIS — E785 Hyperlipidemia, unspecified: Secondary | ICD-10-CM | POA: Diagnosis not present

## 2020-02-02 DIAGNOSIS — E559 Vitamin D deficiency, unspecified: Secondary | ICD-10-CM | POA: Diagnosis not present

## 2020-02-19 DIAGNOSIS — Z0000 Encounter for general adult medical examination without abnormal findings: Secondary | ICD-10-CM | POA: Diagnosis not present

## 2020-03-26 ENCOUNTER — Other Ambulatory Visit: Payer: Self-pay | Admitting: Family Medicine

## 2020-03-26 DIAGNOSIS — N63 Unspecified lump in unspecified breast: Secondary | ICD-10-CM

## 2020-04-12 ENCOUNTER — Other Ambulatory Visit: Payer: Self-pay | Admitting: Family Medicine

## 2020-04-12 DIAGNOSIS — Z1231 Encounter for screening mammogram for malignant neoplasm of breast: Secondary | ICD-10-CM

## 2020-04-25 ENCOUNTER — Other Ambulatory Visit: Payer: Self-pay

## 2020-04-25 ENCOUNTER — Ambulatory Visit
Admission: RE | Admit: 2020-04-25 | Discharge: 2020-04-25 | Disposition: A | Discharge status: Home / Self Care | Payer: BC Managed Care – PPO | Source: Ambulatory Visit | Attending: Family Medicine | Admitting: Family Medicine

## 2020-04-25 DIAGNOSIS — Z1231 Encounter for screening mammogram for malignant neoplasm of breast: Secondary | ICD-10-CM | POA: Diagnosis not present

## 2020-04-25 IMAGING — MG DIGITAL SCREENING BILAT W/ TOMO W/ CAD
8 series · 8 of 24 positions shown · non-contrast
Comparison: Previous exam(s).

CLINICAL DATA: Screening.

EXAM:
DIGITAL SCREENING BILATERAL MAMMOGRAM WITH TOMO AND CAD

[R MLO synth-2D]
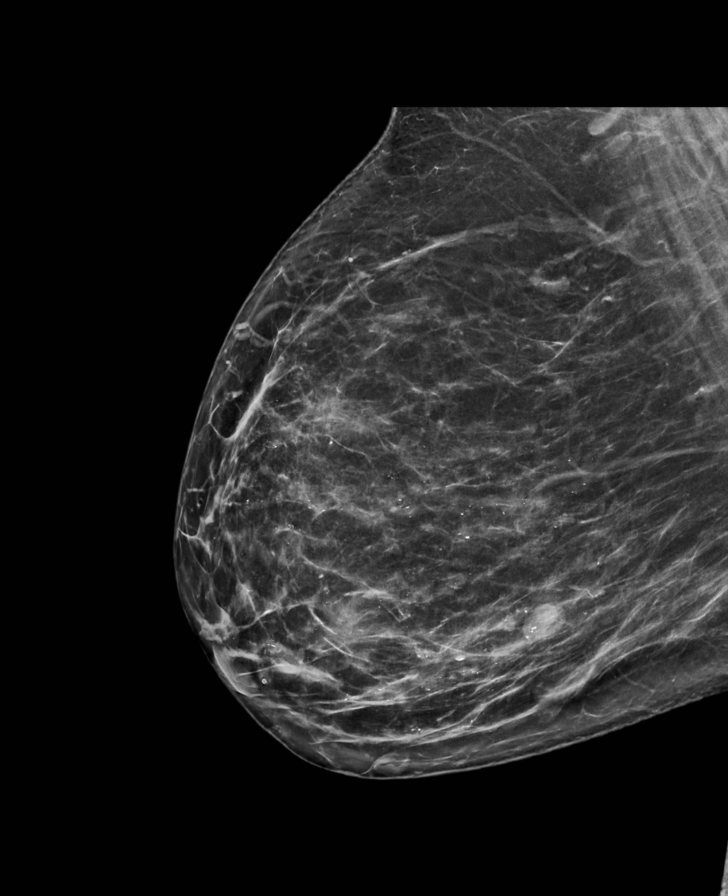

[L MLO synth-2D]
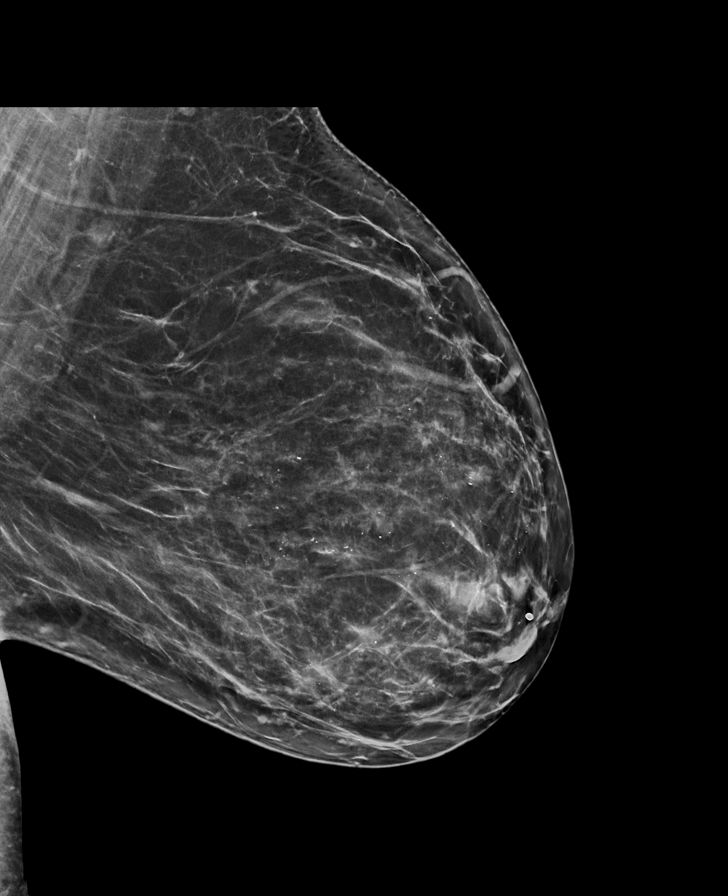

[R CC synth-2D]
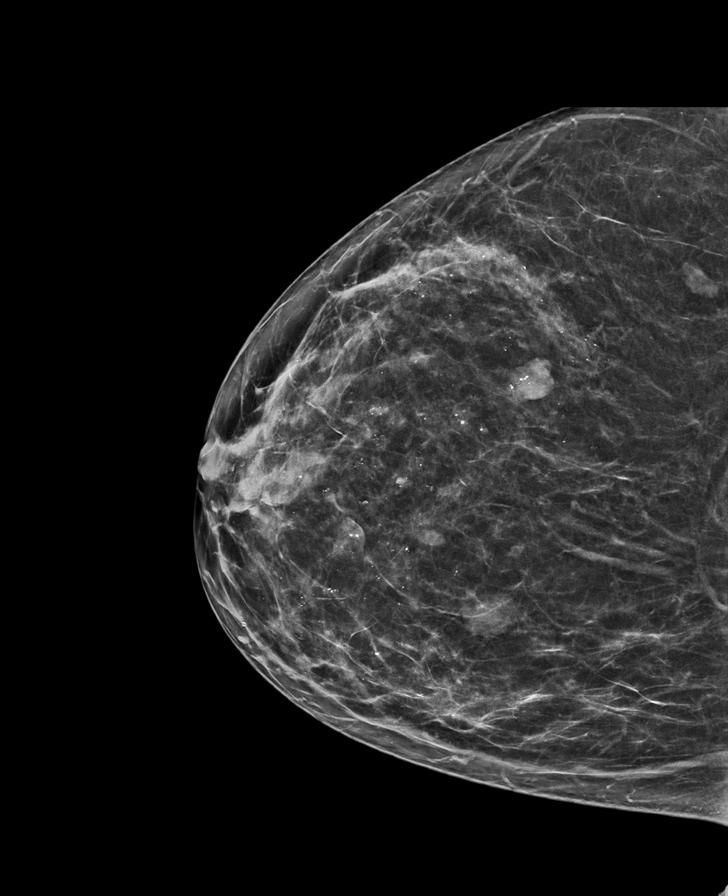

[L CC synth-2D]
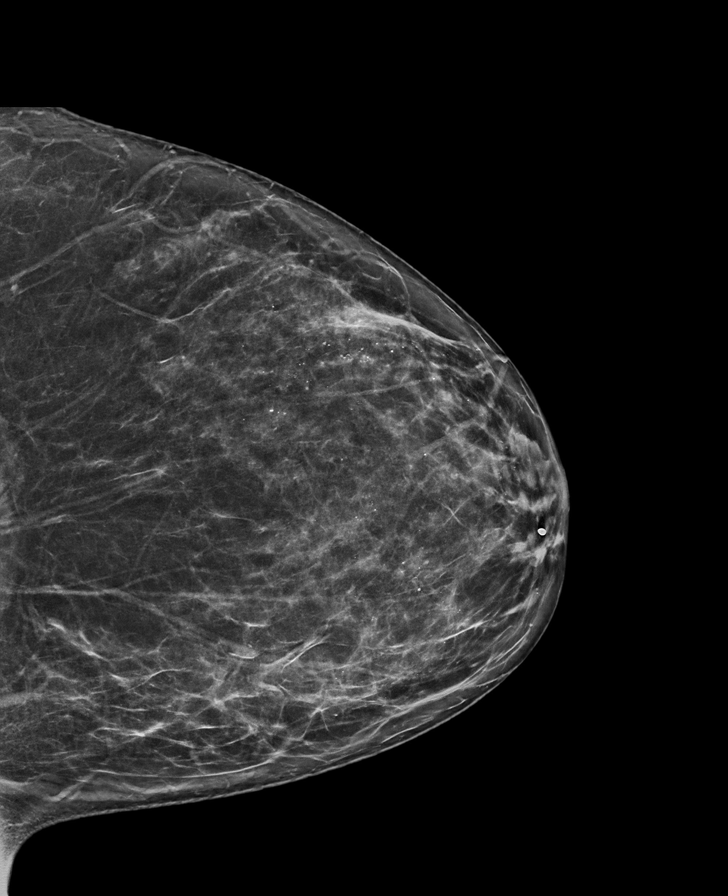

[L MLO tomo · tomo slice 42/83.0]
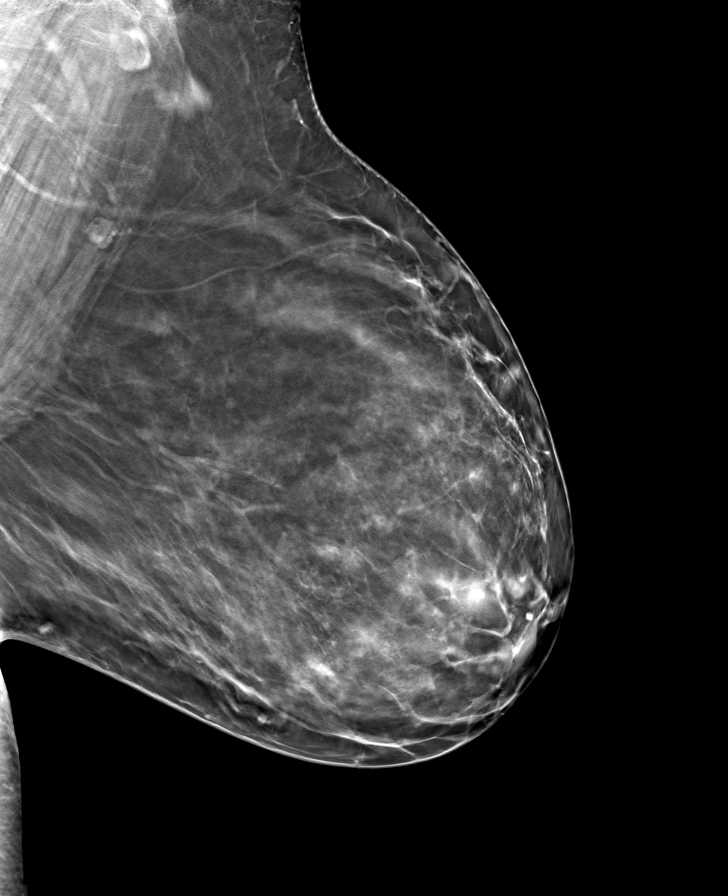

[R CC tomo · tomo slice 37/73.0]
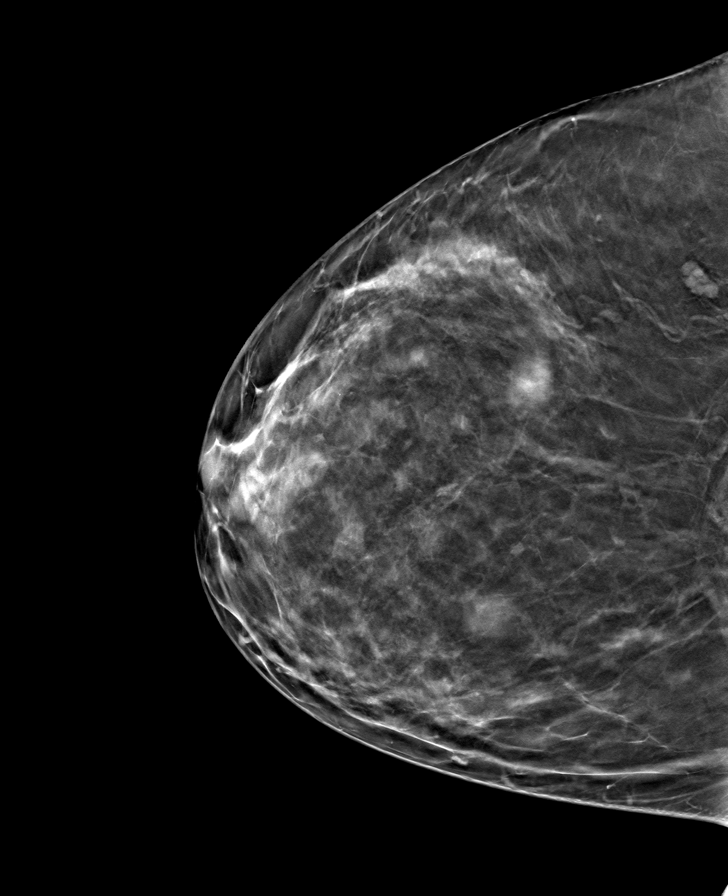

[L CC tomo · tomo slice 38/75.0]
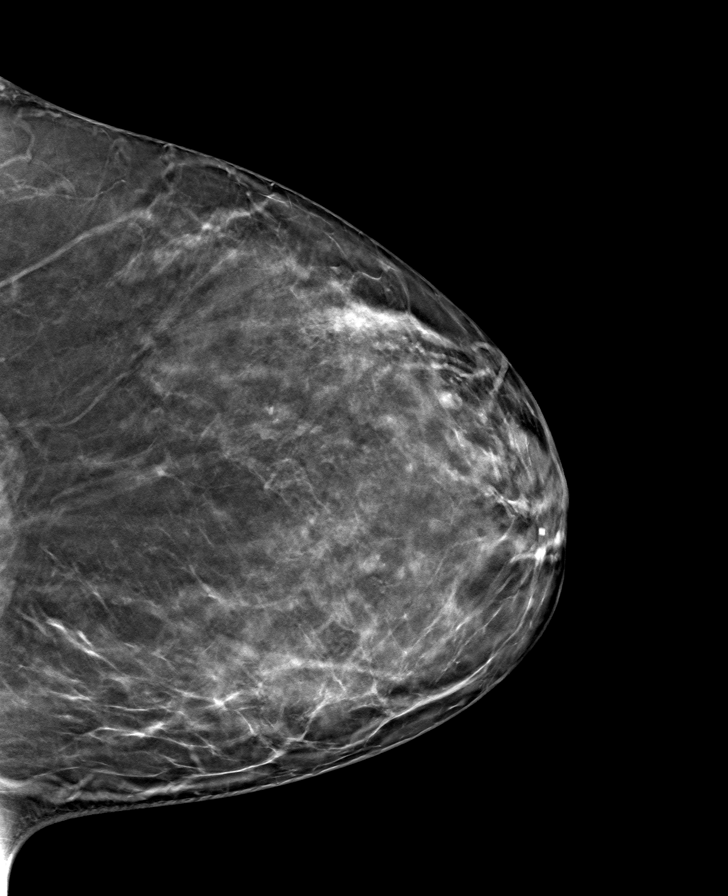

[R MLO tomo · tomo slice 43/85.0]
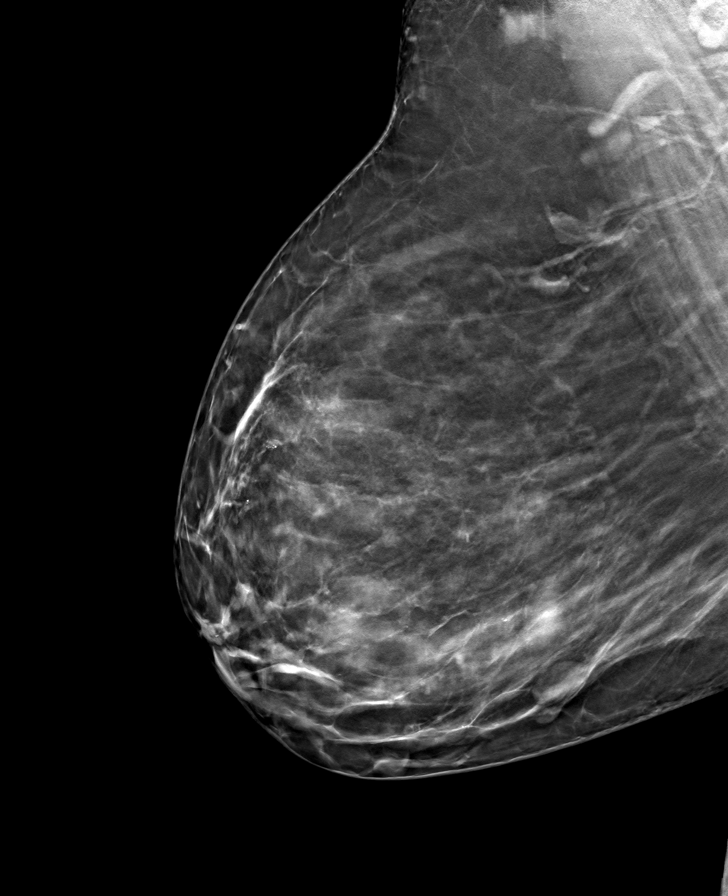

[8 of 24 positions shown; findings below may reference images not displayed]

ACR Breast Density Category c: The breast tissue is heterogeneously
dense, which may obscure small masses.
FINDINGS: There are no findings suspicious for malignancy. Images were
processed with CAD.
IMPRESSION: No mammographic evidence of malignancy. A result letter of this
screening mammogram will be mailed directly to the patient.

RECOMMENDATION:
Screening mammogram in one year. (Code:[5V])

BI-RADS CATEGORY  1: Negative.

## 2020-05-29 DIAGNOSIS — L918 Other hypertrophic disorders of the skin: Secondary | ICD-10-CM | POA: Diagnosis not present

## 2020-09-24 DIAGNOSIS — Z20822 Contact with and (suspected) exposure to covid-19: Secondary | ICD-10-CM | POA: Diagnosis not present

## 2020-09-25 DIAGNOSIS — R059 Cough, unspecified: Secondary | ICD-10-CM | POA: Diagnosis not present

## 2020-09-25 DIAGNOSIS — J019 Acute sinusitis, unspecified: Secondary | ICD-10-CM | POA: Diagnosis not present

## 2020-09-25 DIAGNOSIS — R0981 Nasal congestion: Secondary | ICD-10-CM | POA: Diagnosis not present

## 2020-09-25 DIAGNOSIS — Z03818 Encounter for observation for suspected exposure to other biological agents ruled out: Secondary | ICD-10-CM | POA: Diagnosis not present

## 2020-11-08 DIAGNOSIS — Z23 Encounter for immunization: Secondary | ICD-10-CM | POA: Diagnosis not present

## 2020-12-31 DIAGNOSIS — G4733 Obstructive sleep apnea (adult) (pediatric): Secondary | ICD-10-CM | POA: Diagnosis not present

## 2020-12-31 DIAGNOSIS — I10 Essential (primary) hypertension: Secondary | ICD-10-CM | POA: Diagnosis not present

## 2021-01-17 DIAGNOSIS — Z23 Encounter for immunization: Secondary | ICD-10-CM | POA: Diagnosis not present

## 2021-01-31 DIAGNOSIS — Z23 Encounter for immunization: Secondary | ICD-10-CM | POA: Diagnosis not present

## 2021-01-31 DIAGNOSIS — M5442 Lumbago with sciatica, left side: Secondary | ICD-10-CM | POA: Diagnosis not present

## 2021-02-06 DIAGNOSIS — F331 Major depressive disorder, recurrent, moderate: Secondary | ICD-10-CM | POA: Diagnosis not present

## 2021-02-06 DIAGNOSIS — M549 Dorsalgia, unspecified: Secondary | ICD-10-CM | POA: Diagnosis not present

## 2021-02-20 DIAGNOSIS — F331 Major depressive disorder, recurrent, moderate: Secondary | ICD-10-CM | POA: Diagnosis not present

## 2021-08-04 ENCOUNTER — Other Ambulatory Visit: Payer: Self-pay | Admitting: Family Medicine

## 2021-08-04 DIAGNOSIS — Z1231 Encounter for screening mammogram for malignant neoplasm of breast: Secondary | ICD-10-CM

## 2021-08-09 ENCOUNTER — Ambulatory Visit
Admission: RE | Admit: 2021-08-09 | Discharge: 2021-08-09 | Disposition: A | Discharge status: Home / Self Care | Payer: BC Managed Care – PPO | Source: Ambulatory Visit | Attending: Family Medicine | Admitting: Family Medicine

## 2021-08-09 DIAGNOSIS — Z1231 Encounter for screening mammogram for malignant neoplasm of breast: Secondary | ICD-10-CM

## 2022-11-18 ENCOUNTER — Other Ambulatory Visit (HOSPITAL_COMMUNITY): Payer: Self-pay | Admitting: Family Medicine

## 2022-11-18 DIAGNOSIS — E7800 Pure hypercholesterolemia, unspecified: Secondary | ICD-10-CM

## 2022-11-18 DIAGNOSIS — E119 Type 2 diabetes mellitus without complications: Secondary | ICD-10-CM

## 2022-11-20 ENCOUNTER — Ambulatory Visit (HOSPITAL_BASED_OUTPATIENT_CLINIC_OR_DEPARTMENT_OTHER)
Admission: RE | Admit: 2022-11-20 | Discharge: 2022-11-20 | Disposition: A | Discharge status: Home / Self Care | Payer: BC Managed Care – PPO | Source: Ambulatory Visit | Attending: Family Medicine | Admitting: Family Medicine

## 2022-11-20 DIAGNOSIS — E119 Type 2 diabetes mellitus without complications: Secondary | ICD-10-CM | POA: Insufficient documentation

## 2022-11-20 DIAGNOSIS — E7800 Pure hypercholesterolemia, unspecified: Secondary | ICD-10-CM

## 2023-03-15 ENCOUNTER — Ambulatory Visit
Admission: RE | Admit: 2023-03-15 | Discharge: 2023-03-15 | Disposition: A | Discharge status: Home / Self Care | Payer: No Typology Code available for payment source | Source: Ambulatory Visit | Attending: Family Medicine | Admitting: Family Medicine

## 2023-03-15 ENCOUNTER — Other Ambulatory Visit: Payer: Self-pay | Admitting: Family Medicine

## 2023-03-15 DIAGNOSIS — J189 Pneumonia, unspecified organism: Secondary | ICD-10-CM

## 2023-04-20 ENCOUNTER — Other Ambulatory Visit: Payer: Self-pay | Admitting: Family Medicine

## 2023-04-20 ENCOUNTER — Ambulatory Visit
Admission: RE | Admit: 2023-04-20 | Discharge: 2023-04-20 | Disposition: A | Discharge status: Home / Self Care | Payer: Commercial Managed Care - PPO | Source: Ambulatory Visit | Attending: Family Medicine | Admitting: Family Medicine

## 2023-04-20 DIAGNOSIS — Z1231 Encounter for screening mammogram for malignant neoplasm of breast: Secondary | ICD-10-CM

## 2024-04-11 ENCOUNTER — Encounter: Payer: Self-pay | Admitting: Physical Therapy

## 2024-04-11 ENCOUNTER — Ambulatory Visit: Admitting: Physical Therapy

## 2024-04-11 DIAGNOSIS — M6281 Muscle weakness (generalized): Secondary | ICD-10-CM | POA: Diagnosis present

## 2024-04-11 DIAGNOSIS — R262 Difficulty in walking, not elsewhere classified: Secondary | ICD-10-CM | POA: Diagnosis present

## 2024-04-11 DIAGNOSIS — M5459 Other low back pain: Secondary | ICD-10-CM | POA: Diagnosis present

## 2024-04-11 DIAGNOSIS — M62830 Muscle spasm of back: Secondary | ICD-10-CM | POA: Insufficient documentation

## 2024-04-11 NOTE — Therapy (Signed)
 OUTPATIENT PHYSICAL THERAPY THORACOLUMBAR EVALUATION   Patient Name: Renee Mills MRN: [id number] DOB:19-Dec-1960, 63 y.o., female Today's Date: 04/11/2024  END OF SESSION:  PT End of Session - 04/11/24 1756     Visit Number 1    Date for PT Re-Evaluation 07/12/24    Authorization Type [employer]    PT Start Time 1753    PT Stop Time 1834    PT Time Calculation (min) 41 min    Activity Tolerance Patient tolerated treatment well    Behavior During Therapy WFL for tasks assessed/performed          Past Medical History:  Diagnosis Date   Anemia    hx of   Arthritis    Bronchitis    hx of   Carpal tunnel syndrome of right wrist    FNHTR (febrile nonhemolytic transfusion reaction)    Genital herpes    Headache(784.0)    migraines   Hyperlipidemia    Hypertension    Pneumonia    hx of    Urinary (tract) obstruction    hx of   Vitamin deficiency    Past Surgical History:  Procedure Laterality Date   ABDOMINAL HYSTERECTOMY     CARDIOVASCULAR STRESS TEST     2000   CESAREAN SECTION     CHOLECYSTECTOMY     DOPPLER ECHOCARDIOGRAPHY     2000   HAMMER TOE SURGERY  2000   both feet   HYPERPLASIA TISSUE EXCISION     LIVER SURGERY     TUMOR REMOVAL     cecum gland    Patient Active Problem List   Diagnosis Date Noted   Carpal tunnel syndrome 11/05/2014   Arm weakness 10/14/2014   Chest pain at rest 10/14/2014   Essential hypertension 10/14/2014   Hyperlipidemia 10/14/2014   TIA (transient ischemic attack)    Paresthesia of right arm    Colonic mass 07/21/2012    PCP: Candance Certain, MD  REFERRING PROVIDER: Morey Ar, PA  REFERRING DIAG: sciatica  Rationale for Evaluation and Treatment: Rehabilitation  THERAPY DIAG:  Other low back pain  Muscle spasm of back  Muscle weakness (generalized)  Difficulty in walking, not elsewhere classified  ONSET DATE: 03/28/24  SUBJECTIVE:                                                                                                                                                                                            SUBJECTIVE STATEMENT: Reports has had a few episodes of sciatica, and she would do stretches and it would get better.  She reports that this episode the pain just has not gotten  better.REports that it is just getting worse, and is having difficulty sleeping.  PERTINENT HISTORY:  See above  PAIN:  Are you having pain? Yes: NPRS scale: 7/10 Pain location: left buttock, down the left leg to the bottom of the left foot Pain description: sharp, ache in the left buttock, burning in the leg Aggravating factors: bending, lifting, standing or sitting > 1 hour pain up to 10/10 Relieving factors: OTC pain meds, lie down, not moving, lying on the right side pain at best can be 3/10  PRECAUTIONS: None  RED FLAGS: None   WEIGHT BEARING RESTRICTIONS: No  FALLS:  Has patient fallen in last 6 months? No  LIVING ENVIRONMENT: Lives with: lives with their family Lives in: House/apartment Stairs: Yes: Internal: 12 steps; can reach both Has following equipment at home: None  OCCUPATION: mostly on phone, can sit and stand  PLOF: Independent and some housework, walking   PATIENT GOALS: less pain, better  NEXT MD VISIT: none scheduled  OBJECTIVE:  Note: Objective measures were completed at Evaluation unless otherwise noted.  DIAGNOSTIC FINDINGS:  X-rays negative  PATIENT SURVEYS:  odi = 54%  COGNITION: Overall cognitive status: Within functional limits for tasks assessed      SENSATION: WFL  MUSCLE LENGTH: SLR 50 degrees  POSTURE: rounded shoulders and forward head  PALPATION: Very tight in the upper traps, tender in the left SI and buttock, reports irritated with touch down the left leg  LUMBAR ROM:   AROM eval  Flexion 50%  Extension Decreased 75%  Right lateral flexion 50%  Left lateral flexion 50%   Right rotation   Left rotation    (Blank rows = not  tested)  LOWER EXTREMITY ROM:   WFL's but guarded and painful  LOWER EXTREMITY MMT:    MMT Right eval Left eval  Hip flexion  4-  Hip extension    Hip abduction  3+  Hip adduction    Hip internal rotation    Hip external rotation    Knee flexion  4-  Knee extension  4-  Ankle dorsiflexion  3+  Ankle plantarflexion  4-  Ankle inversion    Ankle eversion     (Blank rows = not tested)  LUMBAR SPECIAL TESTS:  Straight leg raise test: Positive and Slump test: Positive  FUNCTIONAL TESTS:  Timed up and go (TUG): 24 seconds  GAIT: Distance walked: 60 Assistive device utilized: None Level of assistance: Complete Independence Comments: has a little bit of left foot drop noticed with louder strike, limp on the left   TREATMENT DATE:  04/12/24 Evaluation                                                                                                                                 PATIENT EDUCATION:  Education details: POC/HEP Person educated: Patient Education method: Explanation, Demonstration, Actor cues, Verbal cues, and Handouts Education comprehension: verbalized understanding  HOME EXERCISE PROGRAM:  Access Code: [email or web address] URL: [email or web address] Date: 04/11/2024 Prepared by: Cherylene Corrente  Exercises - Supine Pelvic Tilt  - 2 x daily - 7 x weekly - 1 sets - 10 reps - 5 hold - Supine Lower Trunk Rotation  - 2 x daily - 7 x weekly - 1 sets - 10 reps - 5 hold - Hooklying Single Knee to Chest Stretch  - 2 x daily - 7 x weekly - 1 sets - 10 reps - 5 hold - Supine Double Knee to Chest Modified  - 2 x daily - 7 x weekly - 1 sets - 10 reps - 5 hold - Supine Piriformis Stretch Pulling Heel to Hip  - 2 x daily - 7 x weekly - 1 sets - 10 reps - 30 hold  ASSESSMENT:  CLINICAL IMPRESSION: Patient is a 63 y.o. female who was seen today for physical therapy evaluation and treatment for sciatica on the left side.  Unsure of a cause but this has happened in  the past and would resolve, this time the pain is worse, X-rays show DDD.  Pain down the left buttock and leg, burning and some tingling, she is weaker on the left especially with hip flexion and ankle DF.  OBJECTIVE IMPAIRMENTS: Abnormal gait, cardiopulmonary status limiting activity, decreased activity tolerance, decreased balance, decreased coordination, decreased endurance, decreased knowledge of use of DME, decreased mobility, difficulty walking, decreased ROM, decreased strength, increased fascial restrictions, increased muscle spasms, impaired flexibility, improper body mechanics, postural dysfunction, and pain.   ACTIVITY LIMITATIONS: carrying, lifting, bending, sitting, standing, squatting, sleeping, stairs, and locomotion level  PARTICIPATION LIMITATIONS: cleaning, laundry, shopping, occupation, and yard work  PERSONAL FACTORS:  are also affecting patient's functional outcome.   REHAB POTENTIAL: Good  CLINICAL DECISION MAKING: Evolving/moderate complexity  EVALUATION COMPLEXITY: Moderate   GOALS: Goals reviewed with patient? Yes  SHORT TERM GOALS: Target date: 05/04/24  Independent with initial HEP Baseline: Goal status: INITIAL  LONG TERM GOALS: Target date: 07/12/24  Independent with advanced HEP Baseline:  Goal status: INITIAL  2.  Understand posture and body mechanics Baseline:  Goal status: INITIAL  3.  Decrease pain 50% Baseline:  Goal status: INITIAL  4.  Increase lumbar ROM 25% Baseline:  Goal status: INITIAL  5.  Report able to stand greater than an hour with pain <4/10 Baseline:  Goal status: INITIAL  6.  Increase left LE strength to 4/5 Baseline:  Goal status: INITIAL  PLAN:  PT FREQUENCY: 2x/week  PT DURATION: 12 weeks  PLANNED INTERVENTIONS: 97164- PT Re-evaluation, 97110-Therapeutic exercises, 97530- Therapeutic activity, [id number]- Neuromuscular re-education, 97535- Self Care, 54098- Manual therapy, [id number]- Gait training, [phone]- Electrical  stimulation (unattended), [id number]- Ultrasound, 78295- Traction (mechanical), [id number]- Ionotophoresis 4mg /ml Dexamethasone , Patient/Family education, Balance training, Taping, Joint mobilization, Spinal mobilization, Cryotherapy, and Moist heat.  PLAN FOR NEXT SESSION: gym, core, flexibility, could try manual or other traction   Hollis Lurie, PT 04/11/2024, 5:58 PM

## 2024-04-17 ENCOUNTER — Ambulatory Visit: Admitting: Physical Therapy

## 2024-04-17 ENCOUNTER — Encounter: Payer: Self-pay | Admitting: Physical Therapy

## 2024-04-17 DIAGNOSIS — M5459 Other low back pain: Secondary | ICD-10-CM | POA: Diagnosis not present

## 2024-04-17 DIAGNOSIS — M62830 Muscle spasm of back: Secondary | ICD-10-CM

## 2024-04-17 DIAGNOSIS — R262 Difficulty in walking, not elsewhere classified: Secondary | ICD-10-CM

## 2024-04-17 DIAGNOSIS — M6281 Muscle weakness (generalized): Secondary | ICD-10-CM

## 2024-04-17 NOTE — Therapy (Signed)
 OUTPATIENT PHYSICAL THERAPY THORACOLUMBAR TREATMENT   Patient Name: Renee Mills MRN: [id number] DOB:[birth year], 63 y.o., female Today's Date: 04/17/2024  END OF SESSION:  PT End of Session - 04/17/24 1610     Visit Number 2    Date for PT Re-Evaluation 07/12/24    PT Start Time 1610    PT Stop Time 1645    PT Time Calculation (min) 35 min    Activity Tolerance Patient tolerated treatment well    Behavior During Therapy WFL for tasks assessed/performed          Past Medical History:  Diagnosis Date   Anemia    hx of   Arthritis    Bronchitis    hx of   Carpal tunnel syndrome of right wrist    FNHTR (febrile nonhemolytic transfusion reaction)    Genital herpes    Headache(784.0)    migraines   Hyperlipidemia    Hypertension    Pneumonia    hx of    Urinary (tract) obstruction    hx of   Vitamin deficiency    Past Surgical History:  Procedure Laterality Date   ABDOMINAL HYSTERECTOMY     CARDIOVASCULAR STRESS TEST     2000   CESAREAN SECTION     CHOLECYSTECTOMY     DOPPLER ECHOCARDIOGRAPHY     2000   HAMMER TOE SURGERY  2000   both feet   HYPERPLASIA TISSUE EXCISION     LIVER SURGERY     TUMOR REMOVAL     cecum gland    Patient Active Problem List   Diagnosis Date Noted   Carpal tunnel syndrome 11/05/2014   Arm weakness 10/14/2014   Chest pain at rest 10/14/2014   Essential hypertension 10/14/2014   Hyperlipidemia 10/14/2014   TIA (transient ischemic attack)    Paresthesia of right arm    Colonic mass 07/21/2012    PCP: Regino, MD  REFERRING PROVIDER: Bridgett, [place]  REFERRING DIAG: sciatica  Rationale for Evaluation and Treatment: Rehabilitation  THERAPY DIAG:  Other low back pain  Muscle spasm of back  Muscle weakness (generalized)  Difficulty in walking, not elsewhere classified  ONSET DATE: 03/28/24  SUBJECTIVE:                                                                                                                                                                                            SUBJECTIVE STATEMENT:    Burning sensation form pelvic tilt on HEP,  No change since HEP   Reports has had a few episodes of sciatica, and she would do stretches and it would get better.  She reports that this episode the pain just has not gotten better.REports that it is just getting worse, and is having difficulty sleeping.  PERTINENT HISTORY:  See above  PAIN:  Are you having pain? Yes: NPRS scale: 7/10 Pain location: left buttock, down the left leg to the bottom of the left foot Pain description: sharp, ache in the left buttock, burning in the leg Aggravating factors: bending, lifting, standing or sitting > 1 hour pain up to 10/10 Relieving factors: OTC pain meds, lie down, not moving, lying on the right side pain at best can be 3/10  PRECAUTIONS: None  RED FLAGS: None   WEIGHT BEARING RESTRICTIONS: No  FALLS:  Has patient fallen in last 6 months? No  LIVING ENVIRONMENT: Lives with: lives with their family Lives in: House/apartment Stairs: Yes: Internal: 12 steps; can reach both Has following equipment at home: None  OCCUPATION: mostly on phone, can sit and stand  PLOF: Independent and some housework, walking   PATIENT GOALS: less pain, better  NEXT MD VISIT: none scheduled  OBJECTIVE:  Note: Objective measures were completed at Evaluation unless otherwise noted.  DIAGNOSTIC FINDINGS:  X-rays negative  PATIENT SURVEYS:  odi = 54%  COGNITION: Overall cognitive status: Within functional limits for tasks assessed      SENSATION: WFL  MUSCLE LENGTH: SLR 50 degrees  POSTURE: rounded shoulders and forward head  PALPATION: Very tight in the upper traps, tender in the left SI and buttock, reports irritated with touch down the left leg  LUMBAR ROM:   AROM eval  Flexion 50%  Extension Decreased 75%  Right lateral flexion 50%  Left lateral flexion 50%   Right rotation   Left  rotation    (Blank rows = not tested)  LOWER EXTREMITY ROM:   WFL's but guarded and painful  LOWER EXTREMITY MMT:    MMT Right eval Left eval  Hip flexion  4-  Hip extension    Hip abduction  3+  Hip adduction    Hip internal rotation    Hip external rotation    Knee flexion  4-  Knee extension  4-  Ankle dorsiflexion  3+  Ankle plantarflexion  4-  Ankle inversion    Ankle eversion     (Blank rows = not tested)  LUMBAR SPECIAL TESTS:  Straight leg raise test: Positive and Slump test: Positive  FUNCTIONAL TESTS:  Timed up and go (TUG): 24 seconds  GAIT: Distance walked: 60 Assistive device utilized: None Level of assistance: Complete Independence Comments: has a little bit of left foot drop noticed with louder strike, limp on the left   TREATMENT DATE:  04/17/24 NuStep L 5 x 6 min Instructed her significant other on proper sheet traction  Bridges x5 Passive stretching to bilateral HS, piriformis, ITB, glute, [facility]   04/12/24 Evaluation  PATIENT EDUCATION:  Education details: POC/HEP Person educated: Patient Education method: Explanation, Demonstration, Actor cues, Verbal cues, and Handouts Education comprehension: verbalized understanding  HOME EXERCISE PROGRAM: Access Code: [email or web address] URL: [email or web address] Date: 04/11/2024 Prepared by: Ozell Mainland  Exercises - Supine Pelvic Tilt  - 2 x daily - 7 x weekly - 1 sets - 10 reps - 5 hold - Supine Lower Trunk Rotation  - 2 x daily - 7 x weekly - 1 sets - 10 reps - 5 hold - Hooklying Single Knee to Chest Stretch  - 2 x daily - 7 x weekly - 1 sets - 10 reps - 5 hold - Supine Double Knee to Chest Modified  - 2 x daily - 7 x weekly - 1 sets - 10 reps - 5 hold - Supine Piriformis Stretch Pulling Heel to Hip  - 2 x daily - 7 x weekly - 1 sets - 10 reps - 30  hold  ASSESSMENT:  CLINICAL IMPRESSION: Patient is a 63 y.o. female who was seen today for physical therapy treatment for sciatica on the left side. She enters ~ 10 minutes late. Time spent teaching her husband how to perform proper sheet traction. PT did report a sharp pain with bridges. Bilateral piriformis tightness L>R. Some hesitation with activity. Needs to be introduced to a more progressive session slowly..  OBJECTIVE IMPAIRMENTS: Abnormal gait, cardiopulmonary status limiting activity, decreased activity tolerance, decreased balance, decreased coordination, decreased endurance, decreased knowledge of use of DME, decreased mobility, difficulty walking, decreased ROM, decreased strength, increased fascial restrictions, increased muscle spasms, impaired flexibility, improper body mechanics, postural dysfunction, and pain.   ACTIVITY LIMITATIONS: carrying, lifting, bending, sitting, standing, squatting, sleeping, stairs, and locomotion level  PARTICIPATION LIMITATIONS: cleaning, laundry, shopping, occupation, and yard work  PERSONAL FACTORS:  are also affecting patient's functional outcome.   REHAB POTENTIAL: Good  CLINICAL DECISION MAKING: Evolving/moderate complexity  EVALUATION COMPLEXITY: Moderate   GOALS: Goals reviewed with patient? Yes  SHORT TERM GOALS: Target date: 05/04/24  Independent with initial HEP Baseline: Goal status: INITIAL  LONG TERM GOALS: Target date: 07/12/24  Independent with advanced HEP Baseline:  Goal status: INITIAL  2.  Understand posture and body mechanics Baseline:  Goal status: INITIAL  3.  Decrease pain 50% Baseline:  Goal status: INITIAL  4.  Increase lumbar ROM 25% Baseline:  Goal status: INITIAL  5.  Report able to stand greater than an hour with pain <4/10 Baseline:  Goal status: INITIAL  6.  Increase left LE strength to 4/5 Baseline:  Goal status: INITIAL  PLAN:  PT FREQUENCY: 2x/week  PT DURATION: 12  weeks  PLANNED INTERVENTIONS: 97164- PT Re-evaluation, 97110-Therapeutic exercises, 97530- Therapeutic activity, [id number]- Neuromuscular re-education, 97535- Self Care, [id number]- Manual therapy, [id number]- Gait training, [phone]- Electrical stimulation (unattended), [id number]- Ultrasound, [id number]- Traction (mechanical), [id number]- Ionotophoresis 4mg /ml Dexamethasone , Patient/Family education, Balance training, Taping, Joint mobilization, Spinal mobilization, Cryotherapy, and Moist heat.  PLAN FOR NEXT SESSION: gym, core, flexibility, could try manual or other traction   Tanda KANDICE Sorrow, PTA 04/17/2024, 4:11 PM

## 2024-04-25 NOTE — Therapy (Signed)
 OUTPATIENT PHYSICAL THERAPY THORACOLUMBAR TREATMENT   Patient Name: Renee Mills MRN: [id number] DOB:1961-09-12, 63 y.o., female Today's Date: 04/26/2024  END OF SESSION:  PT End of Session - 04/26/24 1615     Visit Number 3    Date for PT Re-Evaluation 07/12/24    PT Start Time 1615    PT Stop Time 1700    PT Time Calculation (min) 45 min    Activity Tolerance Patient tolerated treatment well    Behavior During Therapy WFL for tasks assessed/performed           Past Medical History:  Diagnosis Date   Anemia    hx of   Arthritis    Bronchitis    hx of   Carpal tunnel syndrome of right wrist    FNHTR (febrile nonhemolytic transfusion reaction)    Genital herpes    Headache(784.0)    migraines   Hyperlipidemia    Hypertension    Pneumonia    hx of    Urinary (tract) obstruction    hx of   Vitamin deficiency    Past Surgical History:  Procedure Laterality Date   ABDOMINAL HYSTERECTOMY     CARDIOVASCULAR STRESS TEST     2000   CESAREAN SECTION     CHOLECYSTECTOMY     DOPPLER ECHOCARDIOGRAPHY     2000   HAMMER TOE SURGERY  2000   both feet   HYPERPLASIA TISSUE EXCISION     LIVER SURGERY     TUMOR REMOVAL     cecum gland    Patient Active Problem List   Diagnosis Date Noted   Carpal tunnel syndrome 11/05/2014   Arm weakness 10/14/2014   Chest pain at rest 10/14/2014   Essential hypertension 10/14/2014   Hyperlipidemia 10/14/2014   TIA (transient ischemic attack)    Paresthesia of right arm    Colonic mass 07/21/2012    PCP: Regino, MD  REFERRING PROVIDER: Bridgett, [place]  REFERRING DIAG: sciatica  Rationale for Evaluation and Treatment: Rehabilitation  THERAPY DIAG:  Other low back pain  Muscle spasm of back  Muscle weakness (generalized)  Difficulty in walking, not elsewhere classified  ONSET DATE: 03/28/24  SUBJECTIVE:                                                                                                                                                                                            SUBJECTIVE STATEMENT:   Pretty much the same. Still having sciatica, today was actually pretty bad. Woke up with lots of pain.    Reports has had a few episodes of sciatica, and she would do  stretches and it would get better.  She reports that this episode the pain just has not gotten better.REports that it is just getting worse, and is having difficulty sleeping.  PERTINENT HISTORY:  See above  PAIN:  Are you having pain? Yes: NPRS scale: 7/10 Pain location: left buttock, down the left leg to the bottom of the left foot Pain description: sharp, ache in the left buttock, burning in the leg Aggravating factors: bending, lifting, standing or sitting > 1 hour pain up to 10/10 Relieving factors: OTC pain meds, lie down, not moving, lying on the right side pain at best can be 3/10  PRECAUTIONS: None  RED FLAGS: None   WEIGHT BEARING RESTRICTIONS: No  FALLS:  Has patient fallen in last 6 months? No  LIVING ENVIRONMENT: Lives with: lives with their family Lives in: House/apartment Stairs: Yes: Internal: 12 steps; can reach both Has following equipment at home: None  OCCUPATION: mostly on phone, can sit and stand  PLOF: Independent and some housework, walking   PATIENT GOALS: less pain, better  NEXT MD VISIT: none scheduled  OBJECTIVE:  Note: Objective measures were completed at Evaluation unless otherwise noted.  DIAGNOSTIC FINDINGS:  X-rays negative  PATIENT SURVEYS:  odi = 54%  COGNITION: Overall cognitive status: Within functional limits for tasks assessed      SENSATION: WFL  MUSCLE LENGTH: SLR 50 degrees  POSTURE: rounded shoulders and forward head  PALPATION: Very tight in the upper traps, tender in the left SI and buttock, reports irritated with touch down the left leg  LUMBAR ROM:   AROM eval  Flexion 50%  Extension Decreased 75%  Right lateral flexion 50%  Left lateral  flexion 50%   Right rotation   Left rotation    (Blank rows = not tested)  LOWER EXTREMITY ROM:   WFL's but guarded and painful  LOWER EXTREMITY MMT:    MMT Right eval Left eval  Hip flexion  4-  Hip extension    Hip abduction  3+  Hip adduction    Hip internal rotation    Hip external rotation    Knee flexion  4-  Knee extension  4-  Ankle dorsiflexion  3+  Ankle plantarflexion  4-  Ankle inversion    Ankle eversion     (Blank rows = not tested)  LUMBAR SPECIAL TESTS:  Straight leg raise test: Positive and Slump test: Positive  FUNCTIONAL TESTS:  Timed up and go (TUG): 24 seconds  GAIT: Distance walked: 60 Assistive device utilized: None Level of assistance: Complete Independence Comments: has a little bit of left foot drop noticed with louder strike, limp on the left   TREATMENT DATE:  04/26/24 Seated piriformis stretch  Passive LE stretches- HS, glutes, piriformis, ITB Sciatic nerve glides x5 Manual single leg traction  Feet on pball rotations and knees to chest x10 Sidelying clamshells x10 Deadlifts with 5# kettle bell x10 NuStep L5x82mins    04/17/24 NuStep L 5 x 6 min Instructed her significant other on proper sheet traction  Bridges x5 Passive stretching to bilateral HS, piriformis, ITB, glute, [facility]   04/12/24 Evaluation  PATIENT EDUCATION:  Education details: POC/HEP Person educated: Patient Education method: Explanation, Demonstration, Actor cues, Verbal cues, and Handouts Education comprehension: verbalized understanding  HOME EXERCISE PROGRAM: Access Code: [email or web address] URL: [email or web address] Date: 04/11/2024 Prepared by: Ozell Mainland  Exercises - Supine Pelvic Tilt  - 2 x daily - 7 x weekly - 1 sets - 10 reps - 5 hold - Supine Lower Trunk Rotation  - 2 x daily - 7 x weekly - 1 sets - 10 reps  - 5 hold - Hooklying Single Knee to Chest Stretch  - 2 x daily - 7 x weekly - 1 sets - 10 reps - 5 hold - Supine Double Knee to Chest Modified  - 2 x daily - 7 x weekly - 1 sets - 10 reps - 5 hold - Supine Piriformis Stretch Pulling Heel to Hip  - 2 x daily - 7 x weekly - 1 sets - 10 reps - 30 hold  ASSESSMENT:  CLINICAL IMPRESSION: Patient is a 63 y.o. female who was seen today for physical therapy treatment for sciatica on the left side. She enters ~ 10 minutes late. Time spent teaching her husband how to perform proper sheet traction. PT did report a sharp pain with bridges. Bilateral piriformis tightness L>R. Some hesitation with activity. Needs to be introduced to a more progressive session slowly..  She reports manual distraction of LLE felt good and that she will ask her husband to try this.   OBJECTIVE IMPAIRMENTS: Abnormal gait, cardiopulmonary status limiting activity, decreased activity tolerance, decreased balance, decreased coordination, decreased endurance, decreased knowledge of use of DME, decreased mobility, difficulty walking, decreased ROM, decreased strength, increased fascial restrictions, increased muscle spasms, impaired flexibility, improper body mechanics, postural dysfunction, and pain.   ACTIVITY LIMITATIONS: carrying, lifting, bending, sitting, standing, squatting, sleeping, stairs, and locomotion level  PARTICIPATION LIMITATIONS: cleaning, laundry, shopping, occupation, and yard work  PERSONAL FACTORS:  are also affecting patient's functional outcome.   REHAB POTENTIAL: Good  CLINICAL DECISION MAKING: Evolving/moderate complexity  EVALUATION COMPLEXITY: Moderate   GOALS: Goals reviewed with patient? Yes  SHORT TERM GOALS: Target date: 05/04/24  Independent with initial HEP Baseline: Goal status: INITIAL  LONG TERM GOALS: Target date: 07/12/24  Independent with advanced HEP Baseline:  Goal status: INITIAL  2.  Understand posture and body  mechanics Baseline:  Goal status: INITIAL  3.  Decrease pain 50% Baseline:  Goal status: INITIAL  4.  Increase lumbar ROM 25% Baseline:  Goal status: INITIAL  5.  Report able to stand greater than an hour with pain <4/10 Baseline:  Goal status: INITIAL  6.  Increase left LE strength to 4/5 Baseline:  Goal status: INITIAL  PLAN:  PT FREQUENCY: 2x/week  PT DURATION: 12 weeks  PLANNED INTERVENTIONS: 97164- PT Re-evaluation, 97110-Therapeutic exercises, 97530- Therapeutic activity, [id number]- Neuromuscular re-education, 97535- Self Care, 02859- Manual therapy, [id number]- Gait training, [phone]- Electrical stimulation (unattended), [id number]- Ultrasound, 02987- Traction (mechanical), [id number]- Ionotophoresis 4mg /ml Dexamethasone , Patient/Family education, Balance training, Taping, Joint mobilization, Spinal mobilization, Cryotherapy, and Moist heat.  PLAN FOR NEXT SESSION: gym, core, flexibility, could try manual or other traction   Almetta Fam, PT 04/26/2024, 4:59 PM

## 2024-04-26 ENCOUNTER — Ambulatory Visit

## 2024-04-26 DIAGNOSIS — M5459 Other low back pain: Secondary | ICD-10-CM | POA: Insufficient documentation

## 2024-04-26 DIAGNOSIS — M62830 Muscle spasm of back: Secondary | ICD-10-CM | POA: Diagnosis present

## 2024-04-26 DIAGNOSIS — R262 Difficulty in walking, not elsewhere classified: Secondary | ICD-10-CM | POA: Diagnosis present

## 2024-04-26 DIAGNOSIS — M6281 Muscle weakness (generalized): Secondary | ICD-10-CM | POA: Insufficient documentation

## 2024-05-04 ENCOUNTER — Ambulatory Visit

## 2024-05-04 DIAGNOSIS — M5459 Other low back pain: Secondary | ICD-10-CM

## 2024-05-04 DIAGNOSIS — M6281 Muscle weakness (generalized): Secondary | ICD-10-CM

## 2024-05-04 DIAGNOSIS — M62830 Muscle spasm of back: Secondary | ICD-10-CM

## 2024-05-04 DIAGNOSIS — R262 Difficulty in walking, not elsewhere classified: Secondary | ICD-10-CM

## 2024-05-04 NOTE — Therapy (Signed)
 OUTPATIENT PHYSICAL THERAPY THORACOLUMBAR TREATMENT   Patient Name: Renee Mills MRN: [id number] DOB:Mar 20, 1961, 63 y.o., female Today's Date: 05/04/2024  END OF SESSION:  PT End of Session - 05/04/24 1632     Visit Number 4    Date for PT Re-Evaluation 07/12/24    PT Start Time 1630    PT Stop Time 1715    PT Time Calculation (min) 45 min    Activity Tolerance Patient tolerated treatment well    Behavior During Therapy WFL for tasks assessed/performed            Past Medical History:  Diagnosis Date   Anemia    hx of   Arthritis    Bronchitis    hx of   Carpal tunnel syndrome of right wrist    FNHTR (febrile nonhemolytic transfusion reaction)    Genital herpes    Headache(784.0)    migraines   Hyperlipidemia    Hypertension    Pneumonia    hx of    Urinary (tract) obstruction    hx of   Vitamin deficiency    Past Surgical History:  Procedure Laterality Date   ABDOMINAL HYSTERECTOMY     CARDIOVASCULAR STRESS TEST     2000   CESAREAN SECTION     CHOLECYSTECTOMY     DOPPLER ECHOCARDIOGRAPHY     2000   HAMMER TOE SURGERY  2000   both feet   HYPERPLASIA TISSUE EXCISION     LIVER SURGERY     TUMOR REMOVAL     cecum gland    Patient Active Problem List   Diagnosis Date Noted   Carpal tunnel syndrome 11/05/2014   Arm weakness 10/14/2014   Chest pain at rest 10/14/2014   Essential hypertension 10/14/2014   Hyperlipidemia 10/14/2014   TIA (transient ischemic attack)    Paresthesia of right arm    Colonic mass 07/21/2012    PCP: Regino, MD  REFERRING PROVIDER: Bridgett, [place]  REFERRING DIAG: sciatica  Rationale for Evaluation and Treatment: Rehabilitation  THERAPY DIAG:  Other low back pain  Muscle spasm of back  Muscle weakness (generalized)  Difficulty in walking, not elsewhere classified  ONSET DATE: 03/28/24  SUBJECTIVE:                                                                                                                                                                                            SUBJECTIVE STATEMENT:   After the last time I was having a lot of issues for a few days. The front of my leg and my foot was having nerve pains. Today it is not too  bad, but it still comes and goes.    Reports has had a few episodes of sciatica, and she would do stretches and it would get better.  She reports that this episode the pain just has not gotten better.REports that it is just getting worse, and is having difficulty sleeping.  PERTINENT HISTORY:  See above  PAIN:  Are you having pain? Yes: NPRS scale: 7/10 Pain location: left buttock, down the left leg to the bottom of the left foot Pain description: sharp, ache in the left buttock, burning in the leg Aggravating factors: bending, lifting, standing or sitting > 1 hour pain up to 10/10 Relieving factors: OTC pain meds, lie down, not moving, lying on the right side pain at best can be 3/10  PRECAUTIONS: None  RED FLAGS: None   WEIGHT BEARING RESTRICTIONS: No  FALLS:  Has patient fallen in last 6 months? No  LIVING ENVIRONMENT: Lives with: lives with their family Lives in: House/apartment Stairs: Yes: Internal: 12 steps; can reach both Has following equipment at home: None  OCCUPATION: mostly on phone, can sit and stand  PLOF: Independent and some housework, walking   PATIENT GOALS: less pain, better  NEXT MD VISIT: none scheduled  OBJECTIVE:  Note: Objective measures were completed at Evaluation unless otherwise noted.  DIAGNOSTIC FINDINGS:  X-rays negative  PATIENT SURVEYS:  odi = 54%  COGNITION: Overall cognitive status: Within functional limits for tasks assessed      SENSATION: WFL  MUSCLE LENGTH: SLR 50 degrees  POSTURE: rounded shoulders and forward head  PALPATION: Very tight in the upper traps, tender in the left SI and buttock, reports irritated with touch down the left leg  LUMBAR ROM:   AROM eval  Flexion  50%  Extension Decreased 75%  Right lateral flexion 50%  Left lateral flexion 50%   Right rotation   Left rotation    (Blank rows = not tested)  LOWER EXTREMITY ROM:   WFL's but guarded and painful  LOWER EXTREMITY MMT:    MMT Right eval Left eval  Hip flexion  4-  Hip extension    Hip abduction  3+  Hip adduction    Hip internal rotation    Hip external rotation    Knee flexion  4-  Knee extension  4-  Ankle dorsiflexion  3+  Ankle plantarflexion  4-  Ankle inversion    Ankle eversion     (Blank rows = not tested)  LUMBAR SPECIAL TESTS:  Straight leg raise test: Positive and Slump test: Positive  FUNCTIONAL TESTS:  Timed up and go (TUG): 24 seconds  GAIT: Distance walked: 60 Assistive device utilized: None Level of assistance: Complete Independence Comments: has a little bit of left foot drop noticed with louder strike, limp on the left   TREATMENT DATE:  05/04/24 NuStep L5x25mins  Calf stretch on slant 15s x2  Passive LE stretches- HS, glutes, piriformis, ITB, SKTC Some STM with massage gun to L  SLR x10 Bridges 2x10 blackTB extension 2x10  04/26/24 Seated piriformis stretch  Passive LE stretches- HS, glutes, piriformis, ITB Sciatic nerve glides x5 Manual single leg traction  Feet on pball rotations and knees to chest x10 Sidelying clamshells x10 Deadlifts with 5# kettle bell x10 NuStep L5x20mins    04/17/24 NuStep L 5 x 6 min Instructed her significant other on proper sheet traction  Bridges x5 Passive stretching to bilateral HS, piriformis, ITB, glute, [facility]   04/12/24 Evaluation  PATIENT EDUCATION:  Education details: POC/HEP Person educated: Patient Education method: Explanation, Demonstration, Actor cues, Verbal cues, and Handouts Education comprehension: verbalized understanding  HOME EXERCISE PROGRAM: Access  Code: [email or web address] URL: [email or web address] Date: 04/11/2024 Prepared by: Ozell Mainland  Exercises - Supine Pelvic Tilt  - 2 x daily - 7 x weekly - 1 sets - 10 reps - 5 hold - Supine Lower Trunk Rotation  - 2 x daily - 7 x weekly - 1 sets - 10 reps - 5 hold - Hooklying Single Knee to Chest Stretch  - 2 x daily - 7 x weekly - 1 sets - 10 reps - 5 hold - Supine Double Knee to Chest Modified  - 2 x daily - 7 x weekly - 1 sets - 10 reps - 5 hold - Supine Piriformis Stretch Pulling Heel to Hip  - 2 x daily - 7 x weekly - 1 sets - 10 reps - 30 hold  ASSESSMENT:  CLINICAL IMPRESSION: Patient is a 63 y.o. female who was seen today for physical therapy treatment for sciatica on the left side. SLR was difficulty and caused pain in the LLE. Can feel nerve pain into her leg with back extensions. She will go back to the specialist next week, hopefully they will schedule an MRI.  OBJECTIVE IMPAIRMENTS: Abnormal gait, cardiopulmonary status limiting activity, decreased activity tolerance, decreased balance, decreased coordination, decreased endurance, decreased knowledge of use of DME, decreased mobility, difficulty walking, decreased ROM, decreased strength, increased fascial restrictions, increased muscle spasms, impaired flexibility, improper body mechanics, postural dysfunction, and pain.   ACTIVITY LIMITATIONS: carrying, lifting, bending, sitting, standing, squatting, sleeping, stairs, and locomotion level  PARTICIPATION LIMITATIONS: cleaning, laundry, shopping, occupation, and yard work  PERSONAL FACTORS:  are also affecting patient's functional outcome.   REHAB POTENTIAL: Good  CLINICAL DECISION MAKING: Evolving/moderate complexity  EVALUATION COMPLEXITY: Moderate   GOALS: Goals reviewed with patient? Yes  SHORT TERM GOALS: Target date: 05/04/24  Independent with initial HEP Baseline: Goal status: INITIAL  LONG TERM GOALS: Target date: 07/12/24  Independent with  advanced HEP Baseline:  Goal status: INITIAL  2.  Understand posture and body mechanics Baseline:  Goal status: INITIAL  3.  Decrease pain 50% Baseline:  Goal status: INITIAL  4.  Increase lumbar ROM 25% Baseline:  Goal status: INITIAL  5.  Report able to stand greater than an hour with pain <4/10 Baseline:  Goal status: INITIAL  6.  Increase left LE strength to 4/5 Baseline:  Goal status: INITIAL  PLAN:  PT FREQUENCY: 2x/week  PT DURATION: 12 weeks  PLANNED INTERVENTIONS: 97164- PT Re-evaluation, 97110-Therapeutic exercises, 97530- Therapeutic activity, [id number]- Neuromuscular re-education, 97535- Self Care, 02859- Manual therapy, [id number]- Gait training, [id number]- Electrical stimulation (unattended), [id number]- Ultrasound, 02987- Traction (mechanical), [id number]- Ionotophoresis 4mg /ml Dexamethasone , Patient/Family education, Balance training, Taping, Joint mobilization, Spinal mobilization, Cryotherapy, and Moist heat.  PLAN FOR NEXT SESSION: gym, core, flexibility, could try manual or other traction   Almetta Fam, PT 05/04/2024, 5:15 PM

## 2024-05-09 ENCOUNTER — Ambulatory Visit: Admitting: Physical Therapy

## 2024-06-20 ENCOUNTER — Other Ambulatory Visit: Payer: Self-pay | Admitting: Family Medicine

## 2024-06-20 ENCOUNTER — Encounter: Payer: Self-pay | Admitting: Family Medicine

## 2024-06-20 DIAGNOSIS — R1900 Intra-abdominal and pelvic swelling, mass and lump, unspecified site: Secondary | ICD-10-CM

## 2024-06-21 ENCOUNTER — Encounter: Payer: Self-pay | Admitting: Family Medicine

## 2024-06-23 ENCOUNTER — Ambulatory Visit
Admission: RE | Admit: 2024-06-23 | Discharge: 2024-06-23 | Disposition: A | Discharge status: Home / Self Care | Source: Ambulatory Visit | Attending: Family Medicine | Admitting: Family Medicine

## 2024-06-23 DIAGNOSIS — R1900 Intra-abdominal and pelvic swelling, mass and lump, unspecified site: Secondary | ICD-10-CM

## 2024-06-23 MED ORDER — IOPAMIDOL (ISOVUE-370) INJECTION 76%
80.0000 mL | Freq: Once | INTRAVENOUS | Status: AC | PRN
  Administered 2024-06-23: 80 mL via INTRAVENOUS

## 2024-07-03 ENCOUNTER — Other Ambulatory Visit: Payer: Self-pay | Admitting: Family Medicine

## 2024-07-03 DIAGNOSIS — Z1231 Encounter for screening mammogram for malignant neoplasm of breast: Secondary | ICD-10-CM

## 2024-07-25 ENCOUNTER — Ambulatory Visit
Admission: RE | Admit: 2024-07-25 | Discharge: 2024-07-25 | Disposition: A | Discharge status: Home / Self Care | Source: Ambulatory Visit | Attending: Family Medicine | Admitting: Family Medicine

## 2024-07-25 DIAGNOSIS — Z1231 Encounter for screening mammogram for malignant neoplasm of breast: Secondary | ICD-10-CM

## 2024-07-31 ENCOUNTER — Other Ambulatory Visit: Payer: Self-pay | Admitting: Gastroenterology

## 2024-09-06 ENCOUNTER — Encounter (HOSPITAL_COMMUNITY): Payer: Self-pay | Admitting: Gastroenterology

## 2024-09-06 NOTE — Progress Notes (Signed)
 Attempted to obtain medical history for pre op call via telephone, unable to reach at this time. HIPAA compliant voicemail message left requesting return call to pre surgical testing department.

## 2024-09-12 ENCOUNTER — Encounter (HOSPITAL_COMMUNITY): Payer: Self-pay | Admitting: Gastroenterology

## 2024-09-13 ENCOUNTER — Encounter (HOSPITAL_COMMUNITY)
Admission: RE | Disposition: A | Discharge status: Home / Self Care | Payer: Self-pay | Source: Home / Self Care | Attending: Gastroenterology

## 2024-09-13 ENCOUNTER — Ambulatory Visit (HOSPITAL_BASED_OUTPATIENT_CLINIC_OR_DEPARTMENT_OTHER): Payer: Self-pay | Admitting: Anesthesiology

## 2024-09-13 ENCOUNTER — Ambulatory Visit (HOSPITAL_COMMUNITY)
Admission: RE | Admit: 2024-09-13 | Discharge: 2024-09-13 | Disposition: A | Discharge status: Home / Self Care | Attending: Gastroenterology | Admitting: Gastroenterology

## 2024-09-13 ENCOUNTER — Ambulatory Visit (HOSPITAL_COMMUNITY): Payer: Self-pay | Admitting: Anesthesiology

## 2024-09-13 ENCOUNTER — Encounter (HOSPITAL_COMMUNITY): Payer: Self-pay | Admitting: Gastroenterology

## 2024-09-13 ENCOUNTER — Other Ambulatory Visit: Payer: Self-pay

## 2024-09-13 DIAGNOSIS — G4730 Sleep apnea, unspecified: Secondary | ICD-10-CM | POA: Insufficient documentation

## 2024-09-13 DIAGNOSIS — Z87891 Personal history of nicotine dependence: Secondary | ICD-10-CM | POA: Diagnosis not present

## 2024-09-13 DIAGNOSIS — K862 Cyst of pancreas: Secondary | ICD-10-CM | POA: Diagnosis present

## 2024-09-13 DIAGNOSIS — E119 Type 2 diabetes mellitus without complications: Secondary | ICD-10-CM | POA: Diagnosis not present

## 2024-09-13 DIAGNOSIS — K449 Diaphragmatic hernia without obstruction or gangrene: Secondary | ICD-10-CM | POA: Diagnosis not present

## 2024-09-13 DIAGNOSIS — Z9049 Acquired absence of other specified parts of digestive tract: Secondary | ICD-10-CM | POA: Insufficient documentation

## 2024-09-13 DIAGNOSIS — I10 Essential (primary) hypertension: Secondary | ICD-10-CM

## 2024-09-13 DIAGNOSIS — Z7984 Long term (current) use of oral hypoglycemic drugs: Secondary | ICD-10-CM | POA: Insufficient documentation

## 2024-09-13 DIAGNOSIS — E039 Hypothyroidism, unspecified: Secondary | ICD-10-CM | POA: Insufficient documentation

## 2024-09-13 HISTORY — PX: EUS: SHX5427

## 2024-09-13 HISTORY — PX: ESOPHAGOGASTRODUODENOSCOPY: SHX5428

## 2024-09-13 HISTORY — DX: Type 2 diabetes mellitus without complications: E11.9

## 2024-09-13 LAB — GLUCOSE, CAPILLARY: Glucose-Capillary: 108 mg/dL — ABNORMAL HIGH (ref 70–99)

## 2024-09-13 SURGERY — ULTRASOUND, UPPER GI TRACT, ENDOSCOPIC
Anesthesia: Monitor Anesthesia Care

## 2024-09-13 MED ORDER — FENTANYL CITRATE (PF) 100 MCG/2ML IJ SOLN
INTRAMUSCULAR | Status: AC
  Filled 2024-09-13: qty 2

## 2024-09-13 MED ORDER — FENTANYL CITRATE (PF) 100 MCG/2ML IJ SOLN
50.0000 ug | Freq: Once | INTRAMUSCULAR | Status: AC
  Administered 2024-09-13: 50 ug via INTRAVENOUS

## 2024-09-13 MED ORDER — PHENYLEPHRINE HCL (PRESSORS) 10 MG/ML IV SOLN
INTRAVENOUS | Status: DC | PRN
  Administered 2024-09-13: 80 ug via INTRAVENOUS

## 2024-09-13 MED ORDER — CIPROFLOXACIN IN D5W 400 MG/200ML IV SOLN
INTRAVENOUS | Status: DC | PRN
  Administered 2024-09-13: 400 mg via INTRAVENOUS

## 2024-09-13 MED ORDER — LACTATED RINGERS IV SOLN: INTRAVENOUS | Status: DC | PRN

## 2024-09-13 MED ORDER — SODIUM CHLORIDE 0.9 % IV SOLN: INTRAVENOUS | Status: DC

## 2024-09-13 MED ORDER — ONDANSETRON HCL 4 MG/2ML IJ SOLN
INTRAMUSCULAR | Status: AC
Start: 2024-09-13 — End: 2024-09-13
  Filled 2024-09-13: qty 2

## 2024-09-13 MED ORDER — CIPROFLOXACIN IN D5W 400 MG/200ML IV SOLN
INTRAVENOUS | Status: AC
  Filled 2024-09-13: qty 200

## 2024-09-13 MED ORDER — CIPROFLOXACIN HCL 500 MG PO TABS: 500.0000 mg | ORAL_TABLET | Freq: Two times a day (BID) | ORAL | Status: AC

## 2024-09-13 MED ORDER — LIDOCAINE 2% (20 MG/ML) 5 ML SYRINGE
INTRAMUSCULAR | Status: DC | PRN
  Administered 2024-09-13: 80 mg via INTRAVENOUS

## 2024-09-13 MED ORDER — ONDANSETRON HCL 4 MG/2ML IJ SOLN
4.0000 mg | Freq: Once | INTRAMUSCULAR | Status: AC
  Administered 2024-09-13: 4 mg via INTRAVENOUS

## 2024-09-13 MED ORDER — PROPOFOL 500 MG/50ML IV EMUL
INTRAVENOUS | Status: DC | PRN
  Administered 2024-09-13: 125 ug/kg/min via INTRAVENOUS

## 2024-09-13 NOTE — Transfer of Care (Signed)
 Immediate Anesthesia Transfer of Care Note  Patient: Renee Mills  Procedure(s) Performed: ULTRASOUND, UPPER GI TRACT, ENDOSCOPIC EGD (ESOPHAGOGASTRODUODENOSCOPY)  Patient Location: PACU  Anesthesia Type:MAC  Level of Consciousness: awake and alert   Airway & Oxygen Therapy: Patient Spontanous Breathing and Patient connected to nasal cannula oxygen  Post-op Assessment: Report given to RN and Post -op Vital signs reviewed and stable  Post vital signs: Reviewed  Last Vitals:  Vitals Value Taken Time  BP 110/67 09/13/24 11:14  Temp    Pulse 88 09/13/24 11:15  Resp 25 09/13/24 11:15  SpO2 95 % 09/13/24 11:15  Vitals shown include unfiled device data.  Last Pain:  Vitals:   09/13/24 1114  TempSrc:   PainSc: 0-No pain         Complications: No notable events documented.

## 2024-09-13 NOTE — Anesthesia Postprocedure Evaluation (Signed)
 Anesthesia Post Note  Patient: Renee Mills  Procedure(s) Performed: ULTRASOUND, UPPER GI TRACT, ENDOSCOPIC EGD (ESOPHAGOGASTRODUODENOSCOPY)     Patient location during evaluation: Endoscopy Anesthesia Type: MAC Level of consciousness: awake and alert Pain management: pain level controlled Vital Signs Assessment: post-procedure vital signs reviewed and stable Respiratory status: spontaneous breathing, nonlabored ventilation, respiratory function stable and patient connected to nasal cannula oxygen Cardiovascular status: blood pressure returned to baseline and stable Postop Assessment: no apparent nausea or vomiting Anesthetic complications: no   No notable events documented.  Last Vitals:  Vitals:   09/13/24 1210 09/13/24 1220  BP: 136/88 138/87  Pulse: 62 65  Resp: 18 19  Temp:    SpO2: 93% 92%    Last Pain:  Vitals:   09/13/24 1220  TempSrc:   PainSc: 0-No pain                 Garnette DELENA Gab

## 2024-09-13 NOTE — Discharge Instructions (Signed)

## 2024-09-13 NOTE — Op Note (Signed)
 [facility] Patient Name: Renee Mills Procedure Date: 09/13/2024 MRN: [id number] Attending MD: Elsie Cree , MD, [id number] Date of Birth: [birth year] CSN: [id number] Age: 63 Admit Type: Outpatient Procedure:                Upper EUS Indications:              Pancreatic cyst on CT scan Providers:                Elsie Cree, MD, Robie Breed, RN,                            Haskel Chris, Technician Referring MD:              Medicines:                Monitored Anesthesia Care, Cipro 400 mg IV Complications:            No immediate complications. Estimated Blood Loss:     Estimated blood loss: none. Procedure:                Pre-Anesthesia Assessment:                           - Prior to the procedure, a History and Physical                            was performed, and patient medications and                            allergies were reviewed. The patient's tolerance of                            previous anesthesia was also reviewed. The risks                            and benefits of the procedure and the sedation                            options and risks were discussed with the patient.                            All questions were answered, and informed consent                            was obtained. Prior Anticoagulants: The patient has                            taken no anticoagulant or antiplatelet agents. ASA                            Grade Assessment: III - A patient with severe                            systemic disease. After reviewing the risks and  benefits, the patient was deemed in satisfactory                            condition to undergo the procedure.                           After obtaining informed consent, the endoscope was                            passed under direct vision. Throughout the                            procedure, the patient's blood pressure, pulse, and                             oxygen saturations were monitored continuously. The                            [id number] ([id number] Olympus endosonoscope was                            introduced through the mouth, and advanced to the                            second part of duodenum. The [id number]-[id number] ([id number])                            Olympus endoscope was introduced through the mouth,                            and advanced to the second part of duodenum. The                            upper EUS was accomplished without difficulty. The                            patient tolerated the procedure well. Scope In: Scope Out: Findings:      ENDOSCOPIC FINDING: :      A small hiatal hernia was present.      The entire examined stomach was normal.      The duodenal bulb, first portion of the duodenum and second portion of       the duodenum were normal.      ENDOSONOGRAPHIC FINDING: :      Evidence of a previous cholecystectomy was identified       endosonographically.      There was no sign of significant endosonographic abnormality in the       common bile duct. The maximum diameter of the duct was 6 mm.      No lymphadenopathy seen.      An anechoic lesion suggestive of a cyst was identified in the pancreatic       head. It is not in obvious communication with the pancreatic duct. The       lesion measured 30 mm by 40 mm in maximal cross-sectional diameter.       There were  a few compartments thinly septated. The outer wall of the       lesion was not seen. There was an associated mural nodule in the wall,       very deep to probe, risk-prohibitive to biopsy, about 7 mm x 10 mm in       size. There was no internal debris within the fluid-filled cavity.       Diagnostic needle aspiration for fluid was performed. Color Doppler       imaging was utilized prior to needle puncture to confirm a lack of       significant vascular structures within the needle path. One pass was       made with the 22 gauge needle using a  transduodenal approach. A stylet       was used. The amount of fluid collected was 3 mL. The fluid was clear       and slightly viscous. Sample(s) were sent for amylase, cytology, glucose       and CEA. Impression:               - Small hiatal hernia.                           - Normal stomach.                           - Normal duodenal bulb, first portion of the                            duodenum and second portion of the duodenum.                           - Evidence of a cholecystectomy.                           - There was no sign of significant pathology in the                            common bile duct.                           - A cystic lesion was seen in the pancreatic head.                            Fine needle aspiration for fluid performed.                            Presence of mural nodule imparts higher risk                            profile. Moderate Sedation:      None Recommendation:           - Discharge patient to home (via wheelchair).                           - Resume previous diet today.                           -  Continue present medications.                           - Await cytology and cyst fluid results.                           - Patient likely to benefit from surgical                            consultation. Procedure Code(s):        --- Professional ---                           [phone], Esophagogastroduodenoscopy, flexible,                            transoral; with transendoscopic ultrasound-guided                            intramural or transmural fine needle                            aspiration/biopsy(s) (includes endoscopic                            ultrasound examination of the esophagus, stomach,                            and either the duodenum or a surgically altered                            stomach where the jejunum is examined distal to the                            anastomosis) Diagnosis Code(s):        --- Professional ---                            K44.9, Diaphragmatic hernia without obstruction or                            gangrene                           Z90.49, Acquired absence of other specified parts                            of digestive tract                           K86.2, Cyst of pancreas CPT copyright 2022 [employer]. All rights reserved. The codes documented in this report are preliminary and upon coder review may  be revised to meet current compliance requirements. Elsie Cree, MD 09/13/2024 11:18:49 AM This report has been signed electronically. Number of Addenda: 0

## 2024-09-13 NOTE — H&P (Signed)
 [facility] H/P Note  Chief Complaint: Pancreatic cyst  HPI: Renee Mills is an 63 y.o. female.  Pancreatic cyst.  Nausea.  Past Medical History:  Diagnosis Date   Anemia    hx of   Arthritis    Bronchitis    hx of   Carpal tunnel syndrome of right wrist    Diabetes mellitus without complication (HCC)    FNHTR (febrile nonhemolytic transfusion reaction)    Genital herpes    Headache(784.0)    migraines   Hyperlipidemia    Hypertension    Pneumonia    hx of    Urinary (tract) obstruction    hx of   Vitamin deficiency     Past Surgical History:  Procedure Laterality Date   ABDOMINAL HYSTERECTOMY     CARDIOVASCULAR STRESS TEST     2000   CESAREAN SECTION     CHOLECYSTECTOMY     DOPPLER ECHOCARDIOGRAPHY     2000   HAMMER TOE SURGERY  2000   both feet   HYPERPLASIA TISSUE EXCISION     LIVER SURGERY     TUMOR REMOVAL     cecum gland     Medications Prior to Admission  Medication Sig Dispense Refill   aspirin -acetaminophen -caffeine  (EXCEDRIN  MIGRAINE) 250-250-65 MG per tablet Take 1 tablet by mouth every 6 (six) hours as needed for headache (headache). For headache     Cholecalciferol  (VITAMIN D3) 1000 UNITS CAPS Take 1,000 Units by mouth daily.      losartan -hydrochlorothiazide  (HYZAAR) 100-12.5 MG per tablet Take 1 tablet by mouth daily. (Patient not taking: Reported on 09/12/2024)     metformin (FORTAMET) 500 MG (OSM) 24 hr tablet Take 500 mg by mouth daily with breakfast.      Allergies:  Allergies  Allergen Reactions   Augmentin [Amoxicillin-Pot Clavulanate]    Coconut Fatty Acid Nausea Only   Dilaudid  [Hydromorphone  Hcl]     Headache, hallucinate   Lisinopril     cough    Family History  Problem Relation Age of Onset   Breast cancer Mother 26       Br Ca x2   Arthritis Mother    Hypertension Father    Dementia Father    Heart disease Father    Arthritis Sister     Social History:  reports that she has quit smoking. Her  smoking use included cigarettes. She has never used smokeless tobacco. She reports current alcohol use. She reports that she does not use drugs.   ROS: As per HPI, all others negative   Blood pressure 139/84, pulse 91, temperature 97.9 F (36.6 C), temperature source Temporal, resp. rate (!) 23, height 5' 2.5 (1.588 m), weight 94.8 kg, SpO2 95%. General appearance: NAD HEENT:  [place]/AT, anicteric LUNGS:  No visible distress ABD:  Soft, non-tender NEURO:  No encephalopathy  Results for orders placed or performed during the hospital encounter of 09/13/24 (from the past 48 hours)  Glucose, capillary     Status: Abnormal   Collection Time: 09/13/24 10:17 AM  Result Value Ref Range   Glucose-Capillary 108 (H) 70 - 99 mg/dL    Comment: Glucose reference range applies only to samples taken after fasting for at least 8 hours.   No results found.  Assessment/Plan   Pancreatic cyst. Risks (bleeding, infection, bowel perforation that could require surgery, sedation-related changes in cardiopulmonary systems), benefits (identification and possible treatment of source of symptoms, exclusion of certain causes of symptoms), and alternatives (watchful waiting, radiographic imaging  studies, empiric medical treatment) of upper endoscopy with ultrasound and possible fine needle aspiration (EGD, EUS +/- FNA) were explained to patient/family in detail and patient wishes to proceed.   BURNETTE ELSIE HERO 09/13/2024, 10:32 AM

## 2024-09-13 NOTE — Anesthesia Preprocedure Evaluation (Addendum)
 Anesthesia Evaluation  Patient identified by MRN, date of birth, ID band Patient awake    Reviewed: Allergy & Precautions, NPO status , Patient's Chart, lab work & pertinent test results  Airway Mallampati: II  TM Distance: >3 FB Neck ROM: Full    Dental no notable dental hx. (+) Teeth Intact, Dental Advisory Given   Pulmonary sleep apnea , former smoker   Pulmonary exam normal breath sounds clear to auscultation       Cardiovascular hypertension, + angina  Normal cardiovascular exam Rhythm:Regular Rate:Normal     Neuro/Psych  Headaches    GI/Hepatic   Endo/Other  diabetes, Type 2Hypothyroidism    Renal/GU      Musculoskeletal  (+) Arthritis ,    Abdominal   Peds  Hematology   Anesthesia Other Findings All: Lisinopril and Augmenten  Reproductive/Obstetrics                              Anesthesia Physical Anesthesia Plan  ASA: 3  Anesthesia Plan: MAC   Post-op Pain Management: Minimal or no pain anticipated   Induction: Intravenous  PONV Risk Score and Plan: Treatment may vary due to age or medical condition and Propofol  infusion  Airway Management Planned: Natural Airway and Nasal Cannula  Additional Equipment: None  Intra-op Plan:   Post-operative Plan:   Informed Consent: I have reviewed the patients History and Physical, chart, labs and discussed the procedure including the risks, benefits and alternatives for the proposed anesthesia with the patient or authorized representative who has indicated his/her understanding and acceptance.     Dental advisory given  Plan Discussed with: CRNA  Anesthesia Plan Comments: (Upper GI ultrasound for Pancreatic cyst)         Anesthesia Quick Evaluation

## 2024-09-14 ENCOUNTER — Encounter (HOSPITAL_COMMUNITY): Payer: Self-pay | Admitting: Gastroenterology
